# Patient Record
Sex: Male | Born: 1985 | Race: Asian | Hispanic: Yes | Smoking: Current every day smoker
Health system: Southern US, Community
[De-identification: ages and names within clinical notes are randomized; demographics above are authoritative.]

## PROBLEM LIST (undated history)

## (undated) DIAGNOSIS — K920 Hematemesis: Secondary | ICD-10-CM

## (undated) HISTORY — PX: RIB FRACTURE SURGERY: SHX2358

## (undated) HISTORY — PX: ABDOMINAL SURGERY: SHX537

## (undated) HISTORY — PX: SHOULDER SURGERY: SHX246

## (undated) HISTORY — PX: NO PAST SURGERIES: SHX2092

---

## 2017-07-13 ENCOUNTER — Encounter (HOSPITAL_COMMUNITY): Payer: Self-pay

## 2017-07-13 DIAGNOSIS — K2961 Other gastritis with bleeding: Secondary | ICD-10-CM | POA: Diagnosis present

## 2017-07-13 DIAGNOSIS — Z791 Long term (current) use of non-steroidal anti-inflammatories (NSAID): Secondary | ICD-10-CM

## 2017-07-13 DIAGNOSIS — R0789 Other chest pain: Secondary | ICD-10-CM | POA: Diagnosis not present

## 2017-07-13 DIAGNOSIS — K2921 Alcoholic gastritis with bleeding: Principal | ICD-10-CM | POA: Diagnosis present

## 2017-07-13 DIAGNOSIS — K5730 Diverticulosis of large intestine without perforation or abscess without bleeding: Secondary | ICD-10-CM | POA: Diagnosis present

## 2017-07-13 DIAGNOSIS — F064 Anxiety disorder due to known physiological condition: Secondary | ICD-10-CM | POA: Diagnosis not present

## 2017-07-13 DIAGNOSIS — F1010 Alcohol abuse, uncomplicated: Secondary | ICD-10-CM | POA: Diagnosis present

## 2017-07-13 DIAGNOSIS — R000 Tachycardia, unspecified: Secondary | ICD-10-CM | POA: Diagnosis present

## 2017-07-13 DIAGNOSIS — T39395A Adverse effect of other nonsteroidal anti-inflammatory drugs [NSAID], initial encounter: Secondary | ICD-10-CM | POA: Diagnosis present

## 2017-07-13 DIAGNOSIS — F17210 Nicotine dependence, cigarettes, uncomplicated: Secondary | ICD-10-CM | POA: Diagnosis present

## 2017-07-13 LAB — URINALYSIS, ROUTINE W REFLEX MICROSCOPIC
Bilirubin Urine: NEGATIVE
GLUCOSE, UA: NEGATIVE mg/dL
KETONES UR: NEGATIVE mg/dL
Leukocytes, UA: NEGATIVE
Nitrite: NEGATIVE
PROTEIN: NEGATIVE mg/dL
Specific Gravity, Urine: 1.009 (ref 1.005–1.030)
Squamous Epithelial / LPF: NONE SEEN
pH: 6 (ref 5.0–8.0)

## 2017-07-13 LAB — CBC
HEMATOCRIT: 44.5 % (ref 39.0–52.0)
HEMOGLOBIN: 14.4 g/dL (ref 13.0–17.0)
MCH: 28.2 pg (ref 26.0–34.0)
MCHC: 32.4 g/dL (ref 30.0–36.0)
MCV: 87.3 fL (ref 78.0–100.0)
Platelets: 275 10*3/uL (ref 150–400)
RBC: 5.1 MIL/uL (ref 4.22–5.81)
RDW: 16.8 % — ABNORMAL HIGH (ref 11.5–15.5)
WBC: 7.5 10*3/uL (ref 4.0–10.5)

## 2017-07-13 LAB — COMPREHENSIVE METABOLIC PANEL
ALBUMIN: 4.3 g/dL (ref 3.5–5.0)
ALT: 12 U/L — ABNORMAL LOW (ref 17–63)
ANION GAP: 13 (ref 5–15)
AST: 26 U/L (ref 15–41)
Alkaline Phosphatase: 79 U/L (ref 38–126)
BUN: <5 mg/dL — ABNORMAL LOW (ref 6–20)
CHLORIDE: 104 mmol/L (ref 101–111)
CO2: 21 mmol/L — AB (ref 22–32)
Calcium: 9.1 mg/dL (ref 8.9–10.3)
Creatinine, Ser: 0.77 mg/dL (ref 0.61–1.24)
GFR calc non Af Amer: >60 mL/min (ref >60)
GLUCOSE: 111 mg/dL — AB (ref 65–99)
POTASSIUM: 3.9 mmol/L (ref 3.5–5.1)
SODIUM: 138 mmol/L (ref 135–145)
Total Bilirubin: 0.7 mg/dL (ref 0.3–1.2)
Total Protein: 7.6 g/dL (ref 6.5–8.1)

## 2017-07-13 LAB — LIPASE, BLOOD: LIPASE: 32 U/L (ref 11–51)

## 2017-07-13 LAB — I-STAT CG4 LACTIC ACID, ED: LACTIC ACID, VENOUS: 1.77 mmol/L (ref 0.5–1.9)

## 2017-07-13 MED ORDER — ONDANSETRON 4 MG PO TBDP
4.0000 mg | ORAL_TABLET | Freq: Once | ORAL | Status: AC | PRN
  Administered 2017-07-13: 4 mg via ORAL
  Filled 2017-07-13: qty 1

## 2017-07-13 NOTE — ED Triage Notes (Signed)
Pt reports that since last Thursday he has had generalized abd pain, started vomiting yesterday, hematuresis today and reports dark tarry stool since yesterday.

## 2017-07-14 ENCOUNTER — Emergency Department (HOSPITAL_COMMUNITY): Payer: Self-pay

## 2017-07-14 ENCOUNTER — Other Ambulatory Visit: Payer: Self-pay

## 2017-07-14 ENCOUNTER — Inpatient Hospital Stay (HOSPITAL_COMMUNITY)
Admission: EM | Admit: 2017-07-14 | Discharge: 2017-07-19 | DRG: 379 | Disposition: A | Discharge status: Home / Self Care | Payer: Self-pay | Attending: Internal Medicine | Admitting: Internal Medicine

## 2017-07-14 ENCOUNTER — Encounter (HOSPITAL_COMMUNITY): Payer: Self-pay | Admitting: General Practice

## 2017-07-14 DIAGNOSIS — F17210 Nicotine dependence, cigarettes, uncomplicated: Secondary | ICD-10-CM

## 2017-07-14 DIAGNOSIS — R079 Chest pain, unspecified: Secondary | ICD-10-CM

## 2017-07-14 DIAGNOSIS — Z9889 Other specified postprocedural states: Secondary | ICD-10-CM

## 2017-07-14 DIAGNOSIS — R1012 Left upper quadrant pain: Secondary | ICD-10-CM

## 2017-07-14 DIAGNOSIS — R1013 Epigastric pain: Secondary | ICD-10-CM | POA: Diagnosis present

## 2017-07-14 DIAGNOSIS — Z791 Long term (current) use of non-steroidal anti-inflammatories (NSAID): Secondary | ICD-10-CM

## 2017-07-14 DIAGNOSIS — K920 Hematemesis: Secondary | ICD-10-CM | POA: Diagnosis present

## 2017-07-14 DIAGNOSIS — K2971 Gastritis, unspecified, with bleeding: Secondary | ICD-10-CM | POA: Diagnosis present

## 2017-07-14 HISTORY — DX: Hematemesis: K92.0

## 2017-07-14 LAB — CBC
HCT: 40.2 % (ref 39.0–52.0)
HEMATOCRIT: 39.2 % (ref 39.0–52.0)
HEMOGLOBIN: 12.2 g/dL — AB (ref 13.0–17.0)
Hemoglobin: 12.9 g/dL — ABNORMAL LOW (ref 13.0–17.0)
MCH: 27.6 pg (ref 26.0–34.0)
MCH: 28.6 pg (ref 26.0–34.0)
MCHC: 31.1 g/dL (ref 30.0–36.0)
MCHC: 32.1 g/dL (ref 30.0–36.0)
MCV: 88.7 fL (ref 78.0–100.0)
MCV: 89.1 fL (ref 78.0–100.0)
PLATELETS: 258 10*3/uL (ref 150–400)
Platelets: 237 10*3/uL (ref 150–400)
RBC: 4.42 MIL/uL (ref 4.22–5.81)
RBC: 4.51 MIL/uL (ref 4.22–5.81)
RDW: 16.6 % — ABNORMAL HIGH (ref 11.5–15.5)
RDW: 16.8 % — AB (ref 11.5–15.5)
WBC: 4.5 10*3/uL (ref 4.0–10.5)
WBC: 5.1 10*3/uL (ref 4.0–10.5)

## 2017-07-14 LAB — TYPE AND SCREEN
ABO/RH(D): O POS
Antibody Screen: NEGATIVE

## 2017-07-14 LAB — ABO/RH: ABO/RH(D): O POS

## 2017-07-14 LAB — POC OCCULT BLOOD, ED: Fecal Occult Bld: NEGATIVE

## 2017-07-14 MED ORDER — PROCHLORPERAZINE EDISYLATE 5 MG/ML IJ SOLN
10.0000 mg | Freq: Once | INTRAMUSCULAR | Status: AC
  Administered 2017-07-14: 10 mg via INTRAVENOUS
  Filled 2017-07-14: qty 2

## 2017-07-14 MED ORDER — SODIUM CHLORIDE 0.9 % IV SOLN
8.0000 mg/h | INTRAVENOUS | Status: DC
  Administered 2017-07-14 – 2017-07-15 (×3): 8 mg/h via INTRAVENOUS
  Filled 2017-07-14 (×5): qty 80

## 2017-07-14 MED ORDER — ONDANSETRON 4 MG PO TBDP
4.0000 mg | ORAL_TABLET | Freq: Once | ORAL | Status: AC | PRN
  Administered 2017-07-14: 4 mg via ORAL
  Filled 2017-07-14: qty 1

## 2017-07-14 MED ORDER — ACETAMINOPHEN 650 MG RE SUPP: 650.0000 mg | Freq: Four times a day (QID) | RECTAL | Status: DC | PRN

## 2017-07-14 MED ORDER — ONDANSETRON HCL 4 MG/2ML IJ SOLN
4.0000 mg | Freq: Once | INTRAMUSCULAR | Status: AC
  Administered 2017-07-14: 4 mg via INTRAVENOUS
  Filled 2017-07-14: qty 2

## 2017-07-14 MED ORDER — SODIUM CHLORIDE 0.9 % IV SOLN
INTRAVENOUS | Status: DC
  Administered 2017-07-14 (×2): via INTRAVENOUS

## 2017-07-14 MED ORDER — ACETAMINOPHEN 325 MG PO TABS: 650.0000 mg | ORAL_TABLET | Freq: Four times a day (QID) | ORAL | Status: DC | PRN

## 2017-07-14 MED ORDER — ONDANSETRON HCL 4 MG/2ML IJ SOLN
4.0000 mg | Freq: Three times a day (TID) | INTRAMUSCULAR | Status: DC
Start: 2017-07-14 — End: 2017-07-15
  Administered 2017-07-14 – 2017-07-15 (×3): 4 mg via INTRAVENOUS
  Filled 2017-07-14 (×3): qty 2

## 2017-07-14 MED ORDER — PANTOPRAZOLE SODIUM 40 MG IV SOLR: 40.0000 mg | Freq: Two times a day (BID) | INTRAVENOUS | Status: DC

## 2017-07-14 MED ORDER — HYDROCODONE-ACETAMINOPHEN 5-325 MG PO TABS
1.0000 | ORAL_TABLET | ORAL | Status: DC | PRN
  Administered 2017-07-14: 1 via ORAL
  Administered 2017-07-15 – 2017-07-19 (×13): 2 via ORAL
  Filled 2017-07-14 (×15): qty 2
  Filled 2017-07-14: qty 1
  Filled 2017-07-14 (×3): qty 2

## 2017-07-14 MED ORDER — FAMOTIDINE 20 MG IN NS 100 ML IVPB: 20.0000 mg | Freq: Once | INTRAVENOUS | Status: DC

## 2017-07-14 MED ORDER — PANTOPRAZOLE SODIUM 40 MG IV SOLR
40.0000 mg | Freq: Once | INTRAVENOUS | Status: AC
  Administered 2017-07-14: 40 mg via INTRAVENOUS
  Filled 2017-07-14: qty 40

## 2017-07-14 MED ORDER — HYDROMORPHONE HCL 1 MG/ML IJ SOLN
0.5000 mg | INTRAMUSCULAR | Status: DC | PRN
  Administered 2017-07-14 – 2017-07-16 (×11): 0.5 mg via INTRAVENOUS
  Filled 2017-07-14 (×11): qty 1

## 2017-07-14 MED ORDER — HYDROMORPHONE HCL 1 MG/ML IJ SOLN
0.5000 mg | Freq: Once | INTRAMUSCULAR | Status: AC
  Administered 2017-07-14: 0.5 mg via INTRAVENOUS
  Filled 2017-07-14: qty 1

## 2017-07-14 MED ORDER — POLYETHYLENE GLYCOL 3350 17 G PO PACK
17.0000 g | PACK | Freq: Every day | ORAL | Status: DC | PRN
  Administered 2017-07-17: 17 g via ORAL
  Filled 2017-07-14: qty 1

## 2017-07-14 MED ORDER — HYDROMORPHONE HCL 1 MG/ML IJ SOLN
1.0000 mg | Freq: Once | INTRAMUSCULAR | Status: DC
  Filled 2017-07-14: qty 1

## 2017-07-14 MED ORDER — SODIUM CHLORIDE 0.9 % IV BOLUS (SEPSIS)
500.0000 mL | Freq: Once | INTRAVENOUS | Status: AC
  Administered 2017-07-14: 500 mL via INTRAVENOUS

## 2017-07-14 MED ORDER — IOPAMIDOL (ISOVUE-300) INJECTION 61%
INTRAVENOUS | Status: AC
  Administered 2017-07-14: 100 mL
  Filled 2017-07-14: qty 100

## 2017-07-14 MED ORDER — GI COCKTAIL ~~LOC~~
30.0000 mL | Freq: Once | ORAL | Status: DC
  Filled 2017-07-14: qty 30

## 2017-07-14 MED ORDER — FENTANYL CITRATE (PF) 100 MCG/2ML IJ SOLN
50.0000 ug | Freq: Once | INTRAMUSCULAR | Status: AC
  Administered 2017-07-14: 50 ug via INTRAVENOUS
  Filled 2017-07-14: qty 2

## 2017-07-14 MED ORDER — SODIUM CHLORIDE 0.9 % IV BOLUS (SEPSIS)
1000.0000 mL | Freq: Once | INTRAVENOUS | Status: AC
  Administered 2017-07-14: 1000 mL via INTRAVENOUS

## 2017-07-14 NOTE — ED Notes (Signed)
GI at bedside
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
## 2017-07-14 NOTE — ED Notes (Signed)
Patient transported to CT 
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
## 2017-07-14 NOTE — ED Notes (Signed)
Patient came up to registration window saying that "he's been throwing up blood."
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
## 2017-07-15 ENCOUNTER — Encounter (HOSPITAL_COMMUNITY): Payer: Self-pay | Admitting: Certified Registered Nurse Anesthetist

## 2017-07-15 ENCOUNTER — Encounter (HOSPITAL_COMMUNITY)
Admission: EM | Disposition: A | Discharge status: Home / Self Care | Payer: Self-pay | Source: Home / Self Care | Attending: Internal Medicine

## 2017-07-15 ENCOUNTER — Inpatient Hospital Stay (HOSPITAL_COMMUNITY): Payer: Self-pay | Admitting: Certified Registered Nurse Anesthetist

## 2017-07-15 DIAGNOSIS — R1013 Epigastric pain: Secondary | ICD-10-CM | POA: Diagnosis present

## 2017-07-15 DIAGNOSIS — K922 Gastrointestinal hemorrhage, unspecified: Secondary | ICD-10-CM

## 2017-07-15 DIAGNOSIS — F1099 Alcohol use, unspecified with unspecified alcohol-induced disorder: Secondary | ICD-10-CM

## 2017-07-15 HISTORY — PX: ESOPHAGOGASTRODUODENOSCOPY: SHX5428

## 2017-07-15 LAB — CBC
HCT: 42.1 % (ref 39.0–52.0)
HCT: 40.9 % (ref 39.0–52.0)
Hemoglobin: 13 g/dL (ref 13.0–17.0)
Hemoglobin: 12.9 g/dL — ABNORMAL LOW (ref 13.0–17.0)
MCH: 27.3 pg (ref 26.0–34.0)
MCH: 27.9 pg (ref 26.0–34.0)
MCHC: 30.9 g/dL (ref 30.0–36.0)
MCHC: 31.5 g/dL (ref 30.0–36.0)
MCV: 88.4 fL (ref 78.0–100.0)
MCV: 88.3 fL (ref 78.0–100.0)
PLATELETS: 235 10*3/uL (ref 150–400)
PLATELETS: 250 10*3/uL (ref 150–400)
RBC: 4.76 MIL/uL (ref 4.22–5.81)
RBC: 4.63 MIL/uL (ref 4.22–5.81)
RDW: 16.5 % — AB (ref 11.5–15.5)
RDW: 16.3 % — ABNORMAL HIGH (ref 11.5–15.5)
WBC: 5.3 10*3/uL (ref 4.0–10.5)
WBC: 7.4 10*3/uL (ref 4.0–10.5)

## 2017-07-15 LAB — HIV ANTIBODY (ROUTINE TESTING W REFLEX): HIV Screen 4th Generation wRfx: NONREACTIVE

## 2017-07-15 SURGERY — EGD (ESOPHAGOGASTRODUODENOSCOPY)
Anesthesia: General

## 2017-07-15 MED ORDER — SODIUM CHLORIDE 0.9 % IV SOLN
8.0000 mg | Freq: Three times a day (TID) | INTRAVENOUS | Status: DC
  Administered 2017-07-15: 8 mg via INTRAVENOUS
  Filled 2017-07-15: qty 4

## 2017-07-15 MED ORDER — PROPOFOL 10 MG/ML IV BOLUS
INTRAVENOUS | Status: DC | PRN
  Administered 2017-07-15: 150 mg via INTRAVENOUS

## 2017-07-15 MED ORDER — PANTOPRAZOLE SODIUM 40 MG IV SOLR
40.0000 mg | Freq: Two times a day (BID) | INTRAVENOUS | Status: DC
Start: 2017-07-15 — End: 2017-07-17
  Administered 2017-07-15 – 2017-07-17 (×5): 40 mg via INTRAVENOUS
  Filled 2017-07-15 (×5): qty 40

## 2017-07-15 MED ORDER — DEXAMETHASONE SODIUM PHOSPHATE 10 MG/ML IJ SOLN
INTRAMUSCULAR | Status: DC | PRN
  Administered 2017-07-15: 10 mg via INTRAVENOUS

## 2017-07-15 MED ORDER — MIDAZOLAM HCL 5 MG/5ML IJ SOLN
INTRAMUSCULAR | Status: DC | PRN
  Administered 2017-07-15: 2 mg via INTRAVENOUS

## 2017-07-15 MED ORDER — LACTATED RINGERS IV SOLN
INTRAVENOUS | Status: AC | PRN
  Administered 2017-07-15: 11:00:00 via INTRAVENOUS
  Administered 2017-07-15: 1000 mL via INTRAVENOUS

## 2017-07-15 MED ORDER — ONDANSETRON HCL 4 MG/2ML IJ SOLN
INTRAMUSCULAR | Status: DC | PRN
  Administered 2017-07-15: 4 mg via INTRAVENOUS

## 2017-07-15 MED ORDER — PANTOPRAZOLE SODIUM 40 MG PO TBEC: 40.0000 mg | DELAYED_RELEASE_TABLET | Freq: Every day | ORAL | Status: DC

## 2017-07-15 MED ORDER — SUCCINYLCHOLINE CHLORIDE 200 MG/10ML IV SOSY
PREFILLED_SYRINGE | INTRAVENOUS | Status: DC | PRN
  Administered 2017-07-15: 100 mg via INTRAVENOUS

## 2017-07-15 MED ORDER — SODIUM CHLORIDE 0.9 % IV SOLN
8.0000 mg | Freq: Three times a day (TID) | INTRAVENOUS | Status: DC | PRN
  Administered 2017-07-16: 8 mg via INTRAVENOUS
  Filled 2017-07-15 (×2): qty 4

## 2017-07-15 MED ORDER — FENTANYL CITRATE (PF) 100 MCG/2ML IJ SOLN
INTRAMUSCULAR | Status: DC | PRN
  Administered 2017-07-15 (×2): 50 ug via INTRAVENOUS

## 2017-07-15 MED ORDER — ONDANSETRON HCL 4 MG/2ML IJ SOLN: 8.0000 mg | Freq: Three times a day (TID) | INTRAMUSCULAR | Status: DC

## 2017-07-15 MED ORDER — LIDOCAINE 2% (20 MG/ML) 5 ML SYRINGE
INTRAMUSCULAR | Status: DC | PRN
  Administered 2017-07-15: 80 mg via INTRAVENOUS

## 2017-07-15 NOTE — Transfer of Care (Signed)
Immediate Anesthesia Transfer of Care Note  Patient: Joeseph AmorRoberto Boreman  Procedure(s) Performed: ESOPHAGOGASTRODUODENOSCOPY (EGD) (N/A )  Patient Location: PACU and Endoscopy Unit  Anesthesia Type:General  Level of Consciousness: awake, drowsy and patient cooperative  Airway & Oxygen Therapy: Patient Spontanous Breathing and Patient connected to face mask oxygen  Post-op Assessment: Report given to RN and Post -op Vital signs reviewed and stable  Post vital signs: Reviewed and stable  Last Vitals:  Vitals:   07/15/17 0541 07/15/17 1008  BP: 107/76 133/76  Pulse: 77   Resp: 18 16  Temp: 36.6 C 36.8 C  SpO2: 98% 95%    Last Pain:  Vitals:   07/15/17 1008  TempSrc: Oral  PainSc: 7       Patients Stated Pain Goal: 2 (07/15/17 0730)  Complications: No apparent anesthesia complications

## 2017-07-15 NOTE — Anesthesia Preprocedure Evaluation (Signed)
Anesthesia Evaluation  Patient identified by MRN, date of birth, ID band Patient awake    Reviewed: Allergy & Precautions, H&P , NPO status , Patient's Chart, lab work & pertinent test results  Airway Mallampati: II   Neck ROM: full    Dental   Pulmonary Current Smoker,    breath sounds clear to auscultation       Cardiovascular negative cardio ROS   Rhythm:regular Rate:Normal     Neuro/Psych    GI/Hepatic hematemesis   Endo/Other    Renal/GU      Musculoskeletal   Abdominal   Peds  Hematology   Anesthesia Other Findings   Reproductive/Obstetrics                             Anesthesia Physical Anesthesia Plan  ASA: II  Anesthesia Plan: General   Post-op Pain Management:    Induction: Intravenous, Rapid sequence and Cricoid pressure planned  PONV Risk Score and Plan: 1 and Ondansetron and Treatment may vary due to age or medical condition  Airway Management Planned: Oral ETT  Additional Equipment:   Intra-op Plan:   Post-operative Plan: Extubation in OR  Informed Consent: I have reviewed the patients History and Physical, chart, labs and discussed the procedure including the risks, benefits and alternatives for the proposed anesthesia with the patient or authorized representative who has indicated his/her understanding and acceptance.     Plan Discussed with: CRNA, Anesthesiologist and Surgeon  Anesthesia Plan Comments:         Anesthesia Quick Evaluation

## 2017-07-15 NOTE — Interval H&P Note (Signed)
History and Physical Interval Note:  07/15/2017 10:29 AM  Dylan Carrillo  has presented today for surgery, with the diagnosis of hematemesis, upper abdominal pain  The various methods of treatment have been discussed with the patient and family. After consideration of risks, benefits and other options for treatment, the patient has consented to  Procedure(s): ESOPHAGOGASTRODUODENOSCOPY (EGD) (N/A) as a surgical intervention .  The patient's history has been reviewed, patient examined, no change in status, stable for surgery.  I have reviewed the patient's chart and labs.  Questions were answered to the patient's satisfaction.     Charlie PitterHenry L Danis III

## 2017-07-15 NOTE — Op Note (Signed)
[facility] Patient Name: Dylan Carrillo Procedure Date : 07/15/2017 MRN: [id number] Attending MD: Starr Lake. Myrtie Neither , MD Date of Birth: 1986/04/19 CSN: [id number] Age: 32 Admit Type: Inpatient Procedure:                Upper GI endoscopy Indications:              Epigastric abdominal pain, Hematemesis Providers:                Sherilyn Cooter L. Myrtie Neither, MD, Bonney Leitz, Arlee Muslim                            Tech., Technician, Verita Schneiders, Technician, Levora Angel, CRNA Referring MD:              Medicines:                General Anesthesia Complications:            No immediate complications. Estimated Blood Loss:     Estimated blood loss: none. Procedure:                Pre-Anesthesia Assessment:                           - Prior to the procedure, a History and Physical                            was performed, and patient medications and                            allergies were reviewed. The patient's tolerance of                            previous anesthesia was also reviewed. The risks                            and benefits of the procedure and the sedation                            options and risks were discussed with the patient.                            All questions were answered, and informed consent                            was obtained. Prior Anticoagulants: The patient has                            taken no previous anticoagulant or antiplatelet                            agents. ASA Grade Assessment: III - A patient with  severe systemic disease. After reviewing the risks                            and benefits, the patient was deemed in                            satisfactory condition to undergo the procedure.                           After obtaining informed consent, the endoscope was                            passed under direct vision. Throughout the                            procedure, the  patient's blood pressure, pulse, and                            oxygen saturations were monitored continuously. The                            Endoscope was introduced through the mouth, and                            advanced to the second part of duodenum. The upper                            GI endoscopy was accomplished without difficulty.                            The patient tolerated the procedure well. Scope In: Scope Out: Findings:      The hypopharynx was normal (though visualization limited by ETT). There       was fresh blood seen during intubation. It was cleared with suction and       no underlying bleeding source seen.      The examined esophagus was normal.      Hematin (altered blood/coffee-ground-like material) was found in the       entire examined stomach. Extensive lavage and mucosal inspection       performed, and no bleeding sources seen.      The exam of the stomach was otherwise normal.      The cardia and gastric fundus were normal on retroflexion.      The examined duodenum was normal. Impression:               - Normal larynx.                           - Normal esophagus.                           - Hematin (altered blood/coffee-ground-like                            material) in the entire stomach.                           -  Normal examined duodenum.                           - No specimens collected.                           Bleeding appears to have been from NSAID and                            alcohol-related gastropathy. Moderate Sedation:      GETA Recommendation:           - Return patient to hospital ward for ongoing care.                           - Clear liquid diet. Advance to regular diet as                            tolerated. Home when tolerating regular diet and                            vomiting has ceased. Follow up with GI as needed.                           - Use Protonix (pantoprazole) 40 mg PO daily for 2                             weeks.                           - Post procedure medication orders were given. Procedure Code(s):        --- Professional ---                           [phone], Esophagogastroduodenoscopy, flexible,                            transoral; diagnostic, including collection of                            specimen(s) by brushing or washing, when performed                            (separate procedure) Diagnosis Code(s):        --- Professional ---                           K92.2, Gastrointestinal hemorrhage, unspecified                           R10.13, Epigastric pain                           K92.0, Hematemesis CPT copyright 2016 [employer]. All rights reserved. The codes documented in this report are preliminary and upon coder review may  be revised to meet current compliance requirements. Henry L. Danis, MD 07/15/2017 11:09:47 AM This  report has been signed electronically. Number of Addenda: 0
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
## 2017-07-15 NOTE — Anesthesia Procedure Notes (Addendum)
Procedure Name: Intubation Date/Time: 07/15/2017 10:38 AM Performed by: Lowella Dell, CRNA Pre-anesthesia Checklist: Patient identified, Emergency Drugs available, Suction available and Patient being monitored Patient Re-evaluated:Patient Re-evaluated prior to induction Oxygen Delivery Method: Circle System Utilized Preoxygenation: Pre-oxygenation with 100% oxygen Induction Type: IV induction, Rapid sequence and Cricoid Pressure applied Laryngoscope Size: Mac and 3 Grade View: Grade I Tube type: Oral Tube size: 7.5 mm Number of attempts: 1 Airway Equipment and Method: Stylet Placement Confirmation: ETT inserted through vocal cords under direct vision,  positive ETCO2 and breath sounds checked- equal and bilateral Secured at: 23 cm Tube secured with: Tape Dental Injury: Teeth and Oropharynx as per pre-operative assessment

## 2017-07-16 DIAGNOSIS — F419 Anxiety disorder, unspecified: Secondary | ICD-10-CM

## 2017-07-16 DIAGNOSIS — K2971 Gastritis, unspecified, with bleeding: Secondary | ICD-10-CM | POA: Diagnosis present

## 2017-07-16 DIAGNOSIS — K2901 Acute gastritis with bleeding: Secondary | ICD-10-CM

## 2017-07-16 DIAGNOSIS — Z87828 Personal history of other (healed) physical injury and trauma: Secondary | ICD-10-CM

## 2017-07-16 LAB — CBC
HCT: 41.6 % (ref 39.0–52.0)
Hemoglobin: 13.1 g/dL (ref 13.0–17.0)
MCH: 27.7 pg (ref 26.0–34.0)
MCHC: 31.5 g/dL (ref 30.0–36.0)
MCV: 87.9 fL (ref 78.0–100.0)
Platelets: 249 10*3/uL (ref 150–400)
RBC: 4.73 MIL/uL (ref 4.22–5.81)
RDW: 16 % — ABNORMAL HIGH (ref 11.5–15.5)
WBC: 8.6 10*3/uL (ref 4.0–10.5)

## 2017-07-16 MED ORDER — HYDROMORPHONE HCL 1 MG/ML IJ SOLN
1.0000 mg | INTRAMUSCULAR | Status: DC | PRN
  Administered 2017-07-16 – 2017-07-18 (×11): 1 mg via INTRAVENOUS
  Filled 2017-07-16 (×11): qty 1

## 2017-07-16 MED ORDER — SODIUM CHLORIDE 0.9 % IV BOLUS (SEPSIS)
1000.0000 mL | Freq: Once | INTRAVENOUS | Status: AC
  Administered 2017-07-16: 1000 mL via INTRAVENOUS

## 2017-07-16 NOTE — Progress Notes (Addendum)
[facility] Progress Note   Chief Complaint:   Hematemesis and abdominal pain    SUBJECTIVE:   Ate breakfast then vomited food containing bright red blood    ASSESSMENT AND PLAN:    32 yo with upper abdominal pain / hematemesis. EGD yesterday - blood in hypopharynx. Coffee grounds throughout stomach with identifiable source. Bleeding fel to be secondary to ETOH / NSAID gastropathy. Hgb was remained stable, actually up to 13.1 today.  -PPI daily for 2 weeks -Recommend he stop or significantly reduce NSAID usage.  -having problems with anxiety. Admitting team has asked Chaplain to see.  -would not give patient narcotics for the pain.    OBJECTIVE:      Vital signs in last 24 hours: Temp:  [97.9 F (36.6 C)-98.3 F (36.8 C)] 98.1 F (36.7 C) (02/21 0400) Pulse Rate:  [71-131] 107 (02/21 0400) Resp:  [12-20] 20 (02/21 0400) BP: (111-152)/(59-121) 111/77 (02/21 0400) SpO2:  [95 %-100 %] 96 % (02/21 0400) Weight:  [150 lb (68 kg)] 150 lb (68 kg) (02/20 1008) Last BM Date: 07/15/17 General:   Alert, well-developed,male in NAD EENT:  Normal hearing, non icteric sclera, conjunctive pink.  Heart:  Regular rate and rhythm; no murmurs. No lower extremity edema Pulm: Normal respiratory effort, lungs CTA bilaterally without wheezes or crackles. Abdomen:  Soft, nondistended, nontender.  Normal bowel sounds, no masses felt. No hepatomegaly.    Neurologic:  Alert and  oriented x4;  grossly normal neurologically. Psych:  Pleasant, cooperative.  Normal mood and affect.   Intake/Output from previous day: 02/20 0701 - 02/21 0700 In: 1054 [I.V.:1000; IV Piggyback:54] Out: 501 [Urine:500; Emesis/NG output:1] Intake/Output this shift: No intake/output data recorded.  Lab Results: Recent Labs    07/15/17 0417 07/15/17 1210 07/16/17 0516  WBC 5.3 7.4 8.6  HGB 13.0 12.9* 13.1  HCT 42.1 40.9 41.6  PLT 235 250 249   BMET Recent Labs    07/13/17 2028  NA 138    K 3.9  CL 104  CO2 21*  GLUCOSE 111*  BUN <5*  CREATININE 0.77  CALCIUM 9.1   LFT Recent Labs    07/13/17 2028  PROT 7.6  ALBUMIN 4.3  AST 26  ALT 12*  ALKPHOS 79  BILITOT 0.7    Ct Abdomen Pelvis W Contrast  Result Date: 07/14/2017 CLINICAL DATA:  Generalized abdominal pain and vomiting. EXAM: CT ABDOMEN AND PELVIS WITH CONTRAST TECHNIQUE: Multidetector CT imaging of the abdomen and pelvis was performed using the standard protocol following bolus administration of intravenous contrast. CONTRAST:  ISOVUE-300 IOPAMIDOL (ISOVUE-300) INJECTION 61% COMPARISON:  None. FINDINGS: Lower chest: The lung bases are clear of acute process. No pleural effusion or pulmonary lesions. The heart is normal in size. No pericardial effusion. The distal esophagus and aorta are unremarkable. Hepatobiliary: No focal hepatic lesions or intrahepatic biliary dilatation. The gallbladder is normal. No common bile duct dilatation. Pancreas: No mass, inflammation or ductal dilatation. Spleen: Normal size.  No focal lesions. Adrenals/Urinary Tract: The adrenal glands and kidneys are unremarkable. The bladder is normal. Stomach/Bowel: The stomach, duodenum, small bowel and colon are grossly normal without oral contrast. The terminal ileum is normal. The appendix is normal. Diverticulosis involving the transverse colon but no findings to suggest acute diverticulitis. Vascular/Lymphatic: The aorta is normal in caliber. No dissection. The branch vessels are patent. The major venous structures are patent. No mesenteric or retroperitoneal mass or adenopathy. Small scattered lymph nodes are noted. Reproductive: Normal prostate  gland and seminal vesicles. Other: No pelvic mass or adenopathy. No free pelvic fluid collections. No inguinal mass or adenopathy. No abdominal wall hernia or subcutaneous lesions. Musculoskeletal: No significant bony findings. Lumbar scoliosis and remote appearing compression fracture of L2.  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 IMPRESSION: 1. No acute abdominal/pelvic findings, mass lesions or lymphadenopathy. 2. Transverse colon diverticulosis without findings for acute diverticulitis. 3. Remote L2 compression fracture. Electronically Signed   By: Rudie MeyerP.  Gallerani M.D.   On: 07/14/2017 10:12     Principal Problem:   Hematemesis Active Problems:   Epigastric pain     LOS: 2 days   Willette ClusterPaula Guenther ,NP 07/16/2017, 9:58 AM  Pager number [phone]  I have discussed the case with the PA, and that is the plan I formulated. I personally interviewed and examined the patient.  Thus far, no findings to explain his abdominal pain on EGD or CT scan. I suspect excess NSAIDs, weekend EtOH and some anxiety are contributing to that.  However, NO source of the bleeding was seen on EGD, and there was fresh blood in the hypopharynx during EGD.  While it would not explain his abdominal pain, perhaps there is an ENT source of bleeding. I recommend ENT evaluation.  I also feel opioids are making this nausea and vomiting worse.  He needs treatment for anxiety.  Signing off - call as needed.   Charlie PitterHenry L Danis III Pager [phone]  Mon-Fri 8a-5p [phone] after 5p, weekends, holidays

## 2017-07-16 NOTE — Anesthesia Postprocedure Evaluation (Signed)
Anesthesia Post Note  Patient: Joeseph AmorRoberto Treaster  Procedure(s) Performed: ESOPHAGOGASTRODUODENOSCOPY (EGD) (N/A )     Patient location during evaluation: Endoscopy Anesthesia Type: General Level of consciousness: awake and alert Pain management: pain level controlled Vital Signs Assessment: post-procedure vital signs reviewed and stable Respiratory status: spontaneous breathing, nonlabored ventilation, respiratory function stable and patient connected to nasal cannula oxygen Cardiovascular status: blood pressure returned to baseline and stable Postop Assessment: no apparent nausea or vomiting Anesthetic complications: no    Last Vitals:  Vitals:   07/15/17 2306 07/16/17 0400  BP: 119/88 111/77  Pulse: (!) 131 (!) 107  Resp: 18 20  Temp: 36.8 C 36.7 C  SpO2: 95% 96%    Last Pain:  Vitals:   07/16/17 0755  TempSrc:   PainSc: 9                  Stephaie Dardis S

## 2017-07-16 NOTE — Progress Notes (Signed)
   Subjective:  Patient was seen this morning sitting up in bed. He continues to endorse epigastric and left sided abdominal pain. He also has continued to have episodes of bloody emesis and nausea. States the nausea and pain are not fully managed by his current medications. He was able to tolerate a small amount of food yesterday evening, but this morning after attempting to eat breakfast, he had another episode of hematemesis - breakfast tray in room with bright red blood. Denies palpitations. Does endorse mild lightheadedness/dizziness with standing. Also continues to endorse significant anxiety surrounding this hospitalization due to his previous major surgeries after his work accident. States that he is worried about all of his healthcare, his job, and his family.  Objective:  Vital signs in last 24 hours: Vitals:   07/15/17 1152 07/15/17 1523 07/15/17 2306 07/16/17 0400  BP: 112/87 (!) 152/121 119/88 111/77  Pulse: 84 (!) 109 (!) 131 (!) 107  Resp: 15 20 18 20   Temp: 97.9 F (36.6 C) 98 F (36.7 C) 98.2 F (36.8 C) 98.1 F (36.7 C)  TempSrc: Oral Oral Oral Oral  SpO2: 100% 100% 95% 96%  Weight:      Height:       GEN: Well-appearing, young male sitting up in bed. Appears uncomfortable but in NAD. Alert and oriented. HENT: [place]/AT. Moist mucous membranes. No visible lesions. EYES: PERRL. Sclera non-icteric. Conjunctiva clear. RESP: Clear to auscultation bilaterally. No wheezes, rales, or rhonchi. No increased work of breathing. CV: Normal rate and regular rhythm. No murmurs, gallops, or rubs. No LE edema. ABD: Soft. TTP in epigastric and LUQ. No rebound or guarding. Non-distended. Normoactive bowel sounds. Well-healed midline abdominal scar. Active hematemesis. EXT: No edema. Warm and well perfused. Capillary refill <2sec. NEURO: Cranial nerves II-XII grossly intact. Able to lift all four extremities against gravity. No apparent audiovisual hallucinations. Speech fluent and  appropriate. PSYCH: Patient is anxious and pleasant. Appropriate affect. Well-groomed; speech is appropriate and on-subject.  Assessment/Plan:  Principal Problem:   Hematemesis Active Problems:   Epigastric pain  Dylan Carrillo is a 32yo male with PMH of prior ex-lap secondary to work accident in 2017 who presents with epigastric/LUQ abdominal pain, hematemesis, and melena in the setting of increased NSAID use, concerning for upper GI bleed. EGD 2/20 without area of discrete bleeding. Abdominal pain and hematemesis thought to be secondary to gastropathy, secondary to increased NSAID and alcohol use. Continuing to have significant abdominal pain, hematemesis, and anxiety.  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 Continues to endorse abdominal pain and hematemesis. Hb stable, 13.1 this morning. Patient endorses lightheadedness and dizziness with exertion, likely related to hypovolemia. Denies palpitations. Endorses anxiety surrounding his current hospitalization, will ask chaplain to stop by to assist with support. - IV NS 1L bolus - I/Os - Increase to IV dilaudid 1mg  q4h PRN and PO norco 5-325 q4h PRN for pain control - IV Protonix 40mg  BID - CLD, advance to regular diet as tolerated - IV zofran 8mg  TID PRN - CBC in AM - Transfuse Hb <7 - Will d/c with protonix 40mg  PO daily for 2 weeks and f/u with GI as needed - Will need to establish with PCP on d/c - Consult spiritual services  Dispo: Anticipated discharge in approximately 1-2 day(s).  Scherrie GerlachHuang, Barbara Ahart, MD 07/16/2017, 7:01 AM Pager: Demetrius CharityP [phone]

## 2017-07-16 NOTE — Progress Notes (Signed)
Visited with patient per [facility] to support patient who was having high levels of anxiety and fears.  Patient shared his story and concerns  Pt requested sacred reading and prayer.   Request completed. Patient appeared to be coping better after visit.  Provided presence, empathetic listening, emotional and spiritual support and guidance. Chaplain available as needed.  Venida JarvisWatlington, Tiarra Anastacio, [place], [facility], Pager [phone]

## 2017-07-17 LAB — CBC
HCT: 42.6 % (ref 39.0–52.0)
Hemoglobin: 13.6 g/dL (ref 13.0–17.0)
MCH: 28.6 pg (ref 26.0–34.0)
MCHC: 31.9 g/dL (ref 30.0–36.0)
MCV: 89.5 fL (ref 78.0–100.0)
Platelets: 251 10*3/uL (ref 150–400)
RBC: 4.76 MIL/uL (ref 4.22–5.81)
RDW: 16.8 % — ABNORMAL HIGH (ref 11.5–15.5)
WBC: 4.5 10*3/uL (ref 4.0–10.5)

## 2017-07-17 MED ORDER — PANTOPRAZOLE SODIUM 40 MG PO TBEC
40.0000 mg | DELAYED_RELEASE_TABLET | Freq: Two times a day (BID) | ORAL | Status: DC
  Administered 2017-07-17 – 2017-07-19 (×4): 40 mg via ORAL
  Filled 2017-07-17 (×4): qty 1

## 2017-07-17 NOTE — Progress Notes (Signed)
Pt still throwing up bright flank blood. Prn miralax given for c/o constipation

## 2017-07-17 NOTE — Progress Notes (Signed)
[facility] Progress [facility]  Chief Complaint: Epigastric pain  History:  Dylan Carrillo's epigastric pain is diminishing. He ate breakfast today and had nausea. He brought up some scant blood. He denies chest pain or dyspnea. He melena Dylan [facility]illo remains normal  ROS: Cardiovascular:  no chest pain Respiratory: no dyspnea  Objective:  Med list reviewed  Vital signs in last 24 hrs: Vitals:   07/16/17 2144 07/17/17 0524  BP: 108/71 123/89  Pulse: 88 64  Resp: 16 17  Temp: 98.2 F (36.8 C) 97.8 F (36.6 C)  SpO2: 97% 98%    Physical Exam   HEENT: sclera anicteric, oral mucosa moist without lesions. The flashlight exam sees no bleeding lesion in either nostril, oropharynx/hypopharynx or tongue  Neck: supple, no thyromegaly, JVD or lymphadenopathy  Cardiac: RRR without murmurs, S1S2 heard, no peripheral edema  Pulm: clear to auscultation bilaterally, normal RR and effort noted  Abdomen: soft, mild epigastric tenderness, with active bowel sounds of normal character. No guarding or palpable hepatosplenomegaly, no distention  Skin; warm and dry, no jaundice or rash  Recent Labs:  Recent Labs  Lab 07/15/17 1210 07/16/17 0516 07/17/17 0708  WBC 7.4 8.6 4.5  HGB 12.9* 13.1 13.6  HCT 40.9 41.6 42.6  PLT 250 249 251   Recent Labs  Lab 07/13/17 2028  NA 138  K 3.9  CL 104  CO2 21*  BUN <5*  ALBUMIN 4.3  ALKPHOS 79  ALT 12*  AST 26  GLUCOSE 111*   No results for input(s): INR in the last 168 hours.  @ASSESSMENTPLANBEGIN @ Assessment:  Epigastric pain that appears to be from NSAID abuse and weekend alcohol excess, I believe there is also an overlay of anxiety. Hematemesis with no source found on upper endoscopy. He has not had epistaxis mother was some old blood in his hypopharynx at the time of upper endoscopy done under general anesthesia, but no bleeding source seen, limited exam with upper endoscope.   Plan:  I think he can be discharged today, no NSAIDs or  alcohol. Return as needed.   Dylan Carrillo Pager [phone] Mon-Fri 8a-5p [phone] after 5p, weekends, holidays

## 2017-07-17 NOTE — Progress Notes (Signed)
Subjective:  Patient was seen sitting up in bed this morning. He reports that his epigastric and left sided abdominal pain are improving after the increase in pain medication yesterday. He also reports decrease in the frequency of his emesis, although still having bloody emesis. He was able to tolerate a small amount of food yesterday and is able to drink water. He walked around the floor yesterday. Does endorse some lightheadedness and dizziness with standing. He states that talking to the chaplain yesterday was helpful and that his symptoms of anxiety do not affect him in his usual life. He states that he is really only feeling anxious due to being back in the hospital after his previous major surgeries. We provided reassurance that he was getting better and we would continue to monitor for improvement.  Objective:  Vital signs in last 24 hours: Vitals:   07/16/17 0400 07/16/17 1454 07/16/17 2144 07/17/17 0524  BP: 111/77 120/78 108/71 123/89  Pulse: (!) 107 (!) 101 88 64  Resp: 20 20 16 17   Temp: 98.1 F (36.7 C) 98.2 F (36.8 C) 98.2 F (36.8 C) 97.8 F (36.6 C)  TempSrc: Oral Oral Oral Oral  SpO2: 96% 98% 97% 98%  Weight:      Height:       GEN: Well-appearing, young male sitting up in bed. Appears uncomfortable but in NAD. Alert and oriented. HENT: [place]/AT. Moist mucous membranes. No visible lesions. EYES: PERRL. Sclera non-icteric. Conjunctiva clear. RESP: Clear to auscultation bilaterally. No wheezes, rales, or rhonchi. No increased work of breathing. CV: Normal rate and regular rhythm. No murmurs, gallops, or rubs. No LE edema. ABD: Soft. TTP in epigastric and LUQ. No rebound or guarding. Non-distended. Normoactive bowel sounds. Well-healed midline abdominal scar. EXT: No edema. Warm and well perfused. Capillary refill <2sec. NEURO: Cranial nerves II-XII grossly intact. Able to lift all four extremities against gravity. No apparent audiovisual hallucinations. Speech fluent and  appropriate. PSYCH: Patient is less anxious and pleasant. Appropriate affect. Well-groomed; speech is appropriate and on-subject.  Assessment/Plan:  Principal Problem:   Gastritis with hemorrhage Active Problems:   Hematemesis   Epigastric pain  Dylan Carrillo is a 32yo male with PMH of prior ex-lap secondary to work accident in 2017 who presents with epigastric/LUQ abdominal pain, hematemesis, and melena in the setting of increased NSAID use, concerning for upper GI bleed. EGD 2/20 without area of discrete bleeding. Abdominal pain and hematemesis thought to be secondary to gastropathy, secondary to increased NSAID and alcohol use. Alternatively, could be ENT etiology, although that would not explain his significant abdominal pain. He does report that his abdominal pain and hematemesis are improving, so we will continue conservative management.  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 Reports improvement in abdominal pain and hematemesis. Hb stable, 13.6 this morning. Patient endorses lightheadedness and dizziness with exertion, likely related to hypovolemia in setting of decreased oral intake. Denies palpitations. Discussed discharge planning with patient today. He states that he prefers one more day on his current pain regimen since he feels that it is working and he is able to get some rest finally. Will start weaning IV pain medication tomorrow. He is also amenable to following up in IMTS clinic on discharge to establish care. - IV NS 1L bolus - I/Os - IV dilaudid 1mg  q4h PRN and PO norco 5-325 q4h PRN for pain control - IV Protonix 40mg  BID - CLD, advance to regular diet as tolerated - IV zofran  8mg  TID PRN - CBC, CMP, lipase in AM to assess for other possible etiologies - Transfuse Hb <7 - Will d/c with protonix 40mg  PO daily for 2 weeks and f/u with GI as needed - Will set up appointment in IMTS clinic to establish care on  d/c  Anxiety Surrounding his hospitalization after having significant surgery in the past for a work accident. States that talking to the chaplain yesterday was helpful. Also notes that he does not typically have anxious thoughts when not in the hospital. I suspect his anxiety is related to his hospitalization rather than a chronic issue. We are continuing to provide reassurance regarding his health, which I think helps him feel more calm. He does seem less anxious today. - Continue to monitor - Outpatient PCP f/u for continued care  Dispo: Anticipated discharge in approximately 1-2 day(s).  Dylan Carrillo, Dylan Calles, MD 07/17/2017, 11:45 AM Pager: Demetrius CharityP [phone]

## 2017-07-18 ENCOUNTER — Inpatient Hospital Stay (HOSPITAL_COMMUNITY): Payer: Self-pay

## 2017-07-18 DIAGNOSIS — R079 Chest pain, unspecified: Secondary | ICD-10-CM

## 2017-07-18 LAB — COMPREHENSIVE METABOLIC PANEL
ALT: 15 U/L — ABNORMAL LOW (ref 17–63)
AST: 28 U/L (ref 15–41)
Albumin: 3.8 g/dL (ref 3.5–5.0)
Alkaline Phosphatase: 75 U/L (ref 38–126)
Anion gap: 11 (ref 5–15)
BUN: 10 mg/dL (ref 6–20)
CO2: 24 mmol/L (ref 22–32)
Calcium: 8.5 mg/dL — ABNORMAL LOW (ref 8.9–10.3)
Chloride: 99 mmol/L — ABNORMAL LOW (ref 101–111)
Creatinine, Ser: 0.75 mg/dL (ref 0.61–1.24)
GFR calc Af Amer: >60 mL/min (ref >60)
GFR calc non Af Amer: >60 mL/min (ref >60)
Glucose, Bld: 102 mg/dL — ABNORMAL HIGH (ref 65–99)
Potassium: 4.6 mmol/L (ref 3.5–5.1)
Sodium: 134 mmol/L — ABNORMAL LOW (ref 135–145)
Total Bilirubin: 0.8 mg/dL (ref 0.3–1.2)
Total Protein: 6.5 g/dL (ref 6.5–8.1)

## 2017-07-18 LAB — CBC
HCT: 43.7 % (ref 39.0–52.0)
Hemoglobin: 14.2 g/dL (ref 13.0–17.0)
MCH: 28.6 pg (ref 26.0–34.0)
MCHC: 32.5 g/dL (ref 30.0–36.0)
MCV: 87.9 fL (ref 78.0–100.0)
Platelets: 252 10*3/uL (ref 150–400)
RBC: 4.97 MIL/uL (ref 4.22–5.81)
RDW: 16.3 % — ABNORMAL HIGH (ref 11.5–15.5)
WBC: 6.3 10*3/uL (ref 4.0–10.5)

## 2017-07-18 LAB — LIPASE, BLOOD: Lipase: 26 U/L (ref 11–51)

## 2017-07-18 MED ORDER — HYDROMORPHONE HCL 1 MG/ML IJ SOLN
0.5000 mg | INTRAMUSCULAR | Status: DC | PRN
  Administered 2017-07-18 – 2017-07-19 (×6): 0.5 mg via INTRAVENOUS
  Filled 2017-07-18 (×6): qty 1

## 2017-07-18 NOTE — Progress Notes (Signed)
Subjective:  [name] was seen sitting up in bed this morning. He states that he was able to get a little bit of rest last night. He reports improvement in his abdominal pain and that the frequency of his bloody emesis is decreased. He was able to tolerate a small amount of PO intake yesterday and did walk around the floor yesterday. Endorses mild lightheadedness and dizziness with standing. RN notified me that he does not think patient is throwing up blood, but rather coughing up the blood.  Patient also stated that he had an episode of sharp chest pain yesterday that was initially left sided, then became right sided. Associated with left hand tingling, SOB, and diaphoresis. Patient did state that he felt anxious when that occurred. He has never had this intensity of chest pain before. No family history of MI. Patient does smoke ~3-4 cigarettes daily. He denies current chest pain.  Objective:  Vital signs in last 24 hours: Vitals:   07/17/17 0524 07/17/17 1305 07/17/17 2004 07/18/17 0417  BP: 123/89 (!) 127/92 106/66 106/72  Pulse: 64 99 (!) 101 84  Resp: 17 18 17 18   Temp: 97.8 F (36.6 C) 97.8 F (36.6 C) 98.4 F (36.9 C) 98.4 F (36.9 C)  TempSrc: Oral Oral Oral Oral  SpO2: 98% 100% 99% 98%  Weight:      Height:       GEN: Well-appearing, young male sitting up in bed. intermittently grimaces with pain but in NAD. Alert and oriented. HENT: [place]/AT. Moist mucous membranes. No visible lesions. EYES: PERRL. Sclera non-icteric. Conjunctiva clear. RESP: Clear to auscultation bilaterally. No wheezes, rales, or rhonchi. No increased work of breathing. CV: Normal rate and regular rhythm. No murmurs, gallops, or rubs. No LE edema. Soreness to palpation of anterior chest wall. ABD: Soft. TTP in epigastric and LUQ. No rebound or guarding. Non-distended. Normoactive bowel sounds. Well-healed midline abdominal scar. EXT: No edema. Warm and well perfused. Capillary refill <2sec. NEURO:  Cranial nerves II-XII grossly intact. Able to lift all four extremities against gravity. No apparent audiovisual hallucinations. Speech fluent and appropriate. PSYCH: Patient is less anxious and pleasant. Appropriate affect. Well-groomed; speech is appropriate and on-subject.  Assessment/Plan:  Principal Problem:   Gastritis with hemorrhage Active Problems:   Hematemesis   Epigastric pain  [name] is a 32yo male with PMH of prior ex-lap secondary to work accident in 2017 who presents with epigastric/LUQ abdominal pain, hematemesis, and melena in the setting of increased NSAID use, concerning for upper GI bleed. EGD 2/20 without area of discrete bleeding. Abdominal pain and hematemesis thought to be secondary to gastropathy, secondary to increased NSAID and alcohol use. Alternatively, could be ENT etiology, although that would not explain his significant abdominal pain. He does report that his abdominal pain and hematemesis are improving, so we will continue conservative management.  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 Reports improvement in abdominal pain and decreased frequency of hematemesis. Normal lipase, CMP wnl. Hb stable, 13.6 this morning. Patient endorses lightheadedness and dizziness with exertion, likely related to hypovolemia in setting of decreased oral intake. Denies palpitations. Weaning IV pain medication today. - I/Os - Decrease to IV dilaudid 0.5mg  q4h PRN - PO norco 5-325 q4h PRN for pain control - IV Protonix 40mg  BID - CLD, advance to regular diet as tolerated - IV zofran 8mg  TID PRN - CBC, CMP, lipase in AM to assess for other possible etiologies - Transfuse Hb <7 -  Will d/c with protonix 40mg  PO daily for 2 weeks and f/u with GI as needed - Will set up appointment in IMTS clinic to establish care on d/c  Chest Pain 10 seconds of sharp chest pain yesterday that resolved on its own. Most likely related to anxiety,  however will check EKG and CXR to further evaluate. - EKG - CXR  Anxiety Surrounding his hospitalization after having significant surgery in the past for a work accident. Stated today that he does sometimes feel anxious outside of the hospital. Offered either starting anxiety medication in the hospital vs re-assessment as an outpatient in IMTS clinic. He opted for outpatient f/u. - Continue to monitor - Outpatient PCP f/u for continued care  Dispo: Anticipated discharge in approximately 1-2 day(s).  Scherrie Gerlach, MD 07/18/2017, 7:24 AM Pager: Demetrius Charity [phone]

## 2017-07-19 DIAGNOSIS — K3189 Other diseases of stomach and duodenum: Secondary | ICD-10-CM

## 2017-07-19 DIAGNOSIS — M62830 Muscle spasm of back: Secondary | ICD-10-CM

## 2017-07-19 MED ORDER — ONDANSETRON HCL 4 MG PO TABS: 4.0000 mg | ORAL_TABLET | Freq: Three times a day (TID) | ORAL | 0 refills | Status: AC | PRN

## 2017-07-19 MED ORDER — HYDROMORPHONE HCL 1 MG/ML IJ SOLN
0.5000 mg | Freq: Four times a day (QID) | INTRAMUSCULAR | Status: DC | PRN
  Administered 2017-07-19 (×2): 0.5 mg via INTRAVENOUS
  Filled 2017-07-19 (×2): qty 1

## 2017-07-19 MED ORDER — OXYCODONE HCL 5 MG PO TABS: 5.0000 mg | ORAL_TABLET | Freq: Four times a day (QID) | ORAL | 0 refills | Status: DC | PRN

## 2017-07-19 MED ORDER — PANTOPRAZOLE SODIUM 40 MG PO TBEC: 40.0000 mg | DELAYED_RELEASE_TABLET | Freq: Every day | ORAL | 0 refills | Status: DC

## 2017-07-19 MED ORDER — CYCLOBENZAPRINE HCL 5 MG PO TABS: 5.0000 mg | ORAL_TABLET | Freq: Three times a day (TID) | ORAL | 0 refills | Status: DC | PRN

## 2017-07-19 MED ORDER — ACETAMINOPHEN 325 MG PO TABS: 650.0000 mg | ORAL_TABLET | Freq: Four times a day (QID) | ORAL | 0 refills | Status: DC | PRN

## 2017-07-19 NOTE — Progress Notes (Signed)
Pt reports he vomited blood twice during night. First emesis was flushed by patient before RN could observe, but second time a small amount of bright red blood was observed in toilet.  Also, shortly after patient received Dilaudid at 0211, he went outside after being told he needed to stay on the floor. He agreed, but left anyway. When he returned from outside, again he was told not to leave the floor for his own safety. He says he will not.
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
## 2017-07-19 NOTE — Progress Notes (Signed)
Pt awaiting ride home [place]

## 2017-07-19 NOTE — Progress Notes (Signed)
[name] ride fell through but his boss is getting him uber to hotel.  Awaiting ride.

## 2017-07-19 NOTE — Progress Notes (Signed)
Patient left hospital before RN could D/C his saline lock. Patient was given his discharge instructions earlier.

## 2017-07-20 ENCOUNTER — Telehealth: Payer: Self-pay

## 2017-07-20 NOTE — Telephone Encounter (Signed)
HFU TOC per dr huang, discharge 07/19/2017. 

## 2017-07-24 ENCOUNTER — Ambulatory Visit: Payer: Self-pay

## 2017-07-28 ENCOUNTER — Inpatient Hospital Stay (HOSPITAL_COMMUNITY)
Admission: EM | Admit: 2017-07-28 | Discharge: 2017-07-30 | DRG: 379 | Discharge status: Against Medical Advice | Payer: Self-pay | Attending: Family Medicine | Admitting: Family Medicine

## 2017-07-28 ENCOUNTER — Other Ambulatory Visit: Payer: Self-pay

## 2017-07-28 ENCOUNTER — Encounter (HOSPITAL_COMMUNITY): Payer: Self-pay | Admitting: Emergency Medicine

## 2017-07-28 ENCOUNTER — Emergency Department (HOSPITAL_COMMUNITY): Payer: Self-pay

## 2017-07-28 DIAGNOSIS — Z5321 Procedure and treatment not carried out due to patient leaving prior to being seen by health care provider: Secondary | ICD-10-CM | POA: Diagnosis not present

## 2017-07-28 DIAGNOSIS — R402413 Glasgow coma scale score 13-15, at hospital admission: Secondary | ICD-10-CM | POA: Diagnosis present

## 2017-07-28 DIAGNOSIS — Y92410 Unspecified street and highway as the place of occurrence of the external cause: Secondary | ICD-10-CM

## 2017-07-28 DIAGNOSIS — F17210 Nicotine dependence, cigarettes, uncomplicated: Secondary | ICD-10-CM | POA: Diagnosis present

## 2017-07-28 DIAGNOSIS — K920 Hematemesis: Principal | ICD-10-CM | POA: Diagnosis present

## 2017-07-28 DIAGNOSIS — K2901 Acute gastritis with bleeding: Secondary | ICD-10-CM

## 2017-07-28 DIAGNOSIS — K922 Gastrointestinal hemorrhage, unspecified: Secondary | ICD-10-CM | POA: Diagnosis present

## 2017-07-28 DIAGNOSIS — Z8659 Personal history of other mental and behavioral disorders: Secondary | ICD-10-CM

## 2017-07-28 DIAGNOSIS — Z8719 Personal history of other diseases of the digestive system: Secondary | ICD-10-CM

## 2017-07-28 DIAGNOSIS — Z79899 Other long term (current) drug therapy: Secondary | ICD-10-CM

## 2017-07-28 LAB — URINALYSIS, ROUTINE W REFLEX MICROSCOPIC
BILIRUBIN URINE: NEGATIVE
Glucose, UA: NEGATIVE mg/dL
Hgb urine dipstick: NEGATIVE
KETONES UR: NEGATIVE mg/dL
LEUKOCYTES UA: NEGATIVE
Nitrite: NEGATIVE
PH: 6 (ref 5.0–8.0)
Protein, ur: NEGATIVE mg/dL

## 2017-07-28 LAB — COMPREHENSIVE METABOLIC PANEL
ALK PHOS: 99 U/L (ref 38–126)
ALT: 16 U/L — ABNORMAL LOW (ref 17–63)
ANION GAP: 9 (ref 5–15)
AST: 23 U/L (ref 15–41)
Albumin: 4.9 g/dL (ref 3.5–5.0)
BILIRUBIN TOTAL: 0.7 mg/dL (ref 0.3–1.2)
BUN: 9 mg/dL (ref 6–20)
CALCIUM: 9.6 mg/dL (ref 8.9–10.3)
CO2: 29 mmol/L (ref 22–32)
Chloride: 103 mmol/L (ref 101–111)
Creatinine, Ser: 0.87 mg/dL (ref 0.61–1.24)
GFR calc non Af Amer: >60 mL/min (ref >60)
Glucose, Bld: 90 mg/dL (ref 65–99)
Potassium: 4.5 mmol/L (ref 3.5–5.1)
Sodium: 141 mmol/L (ref 135–145)
TOTAL PROTEIN: 8.3 g/dL — AB (ref 6.5–8.1)

## 2017-07-28 LAB — I-STAT CHEM 8, ED
BUN: 8 mg/dL (ref 6–20)
Calcium, Ion: 1.14 mmol/L — ABNORMAL LOW (ref 1.15–1.40)
Chloride: 102 mmol/L (ref 101–111)
Creatinine, Ser: 0.8 mg/dL (ref 0.61–1.24)
GLUCOSE: 90 mg/dL (ref 65–99)
HCT: 44 % (ref 39.0–52.0)
Hemoglobin: 15 g/dL (ref 13.0–17.0)
Potassium: 3.9 mmol/L (ref 3.5–5.1)
SODIUM: 140 mmol/L (ref 135–145)
TCO2: 27 mmol/L (ref 22–32)

## 2017-07-28 LAB — PROTIME-INR
INR: 0.88
PROTHROMBIN TIME: 11.8 s (ref 11.4–15.2)

## 2017-07-28 LAB — CBC
HCT: 44.9 % (ref 39.0–52.0)
HEMOGLOBIN: 15 g/dL (ref 13.0–17.0)
MCH: 29.2 pg (ref 26.0–34.0)
MCHC: 33.4 g/dL (ref 30.0–36.0)
MCV: 87.5 fL (ref 78.0–100.0)
PLATELETS: 310 10*3/uL (ref 150–400)
RBC: 5.13 MIL/uL (ref 4.22–5.81)
RDW: 15.6 % — ABNORMAL HIGH (ref 11.5–15.5)
WBC: 9.1 10*3/uL (ref 4.0–10.5)

## 2017-07-28 LAB — TYPE AND SCREEN
ABO/RH(D): O POS
Antibody Screen: NEGATIVE

## 2017-07-28 LAB — ABO/RH: ABO/RH(D): O POS

## 2017-07-28 LAB — ETHANOL

## 2017-07-28 MED ORDER — PANTOPRAZOLE SODIUM 40 MG IV SOLR
80.0000 mg | Freq: Once | INTRAVENOUS | Status: AC
  Administered 2017-07-28: 80 mg via INTRAVENOUS
  Filled 2017-07-28: qty 80

## 2017-07-28 MED ORDER — IOPAMIDOL (ISOVUE-300) INJECTION 61%
INTRAVENOUS | Status: AC
  Administered 2017-07-28: 100 mL
  Filled 2017-07-28: qty 100

## 2017-07-28 MED ORDER — OXYCODONE HCL 5 MG PO TABS
5.0000 mg | ORAL_TABLET | Freq: Four times a day (QID) | ORAL | Status: DC | PRN
  Administered 2017-07-29 (×3): 5 mg via ORAL
  Filled 2017-07-28 (×3): qty 1

## 2017-07-28 MED ORDER — HYDROMORPHONE HCL 1 MG/ML IJ SOLN
1.0000 mg | Freq: Once | INTRAMUSCULAR | Status: AC
  Administered 2017-07-28: 1 mg via INTRAVENOUS
  Filled 2017-07-28: qty 1

## 2017-07-28 MED ORDER — MORPHINE SULFATE (PF) 2 MG/ML IV SOLN: 2.0000 mg | INTRAVENOUS | Status: DC | PRN

## 2017-07-28 MED ORDER — METOCLOPRAMIDE HCL 5 MG/ML IJ SOLN
5.0000 mg | Freq: Four times a day (QID) | INTRAMUSCULAR | Status: DC
  Administered 2017-07-29: 5 mg via INTRAVENOUS
  Filled 2017-07-28 (×3): qty 2

## 2017-07-28 MED ORDER — ONDANSETRON HCL 4 MG PO TABS: 4.0000 mg | ORAL_TABLET | Freq: Three times a day (TID) | ORAL | Status: DC | PRN

## 2017-07-28 MED ORDER — LORAZEPAM 2 MG/ML IJ SOLN
1.0000 mg | Freq: Once | INTRAMUSCULAR | Status: AC
  Administered 2017-07-28: 1 mg via INTRAVENOUS
  Filled 2017-07-28: qty 1

## 2017-07-28 MED ORDER — LORAZEPAM 2 MG/ML IJ SOLN
2.0000 mg | Freq: Once | INTRAMUSCULAR | Status: AC
  Administered 2017-07-28: 2 mg via INTRAVENOUS
  Filled 2017-07-28: qty 1

## 2017-07-28 MED ORDER — ONDANSETRON HCL 4 MG/2ML IJ SOLN
4.0000 mg | Freq: Four times a day (QID) | INTRAMUSCULAR | Status: DC | PRN
Start: 2017-07-28 — End: 2017-07-30
  Administered 2017-07-29 (×2): 4 mg via INTRAVENOUS
  Filled 2017-07-28 (×2): qty 2

## 2017-07-28 MED ORDER — ONDANSETRON HCL 4 MG PO TABS: 4.0000 mg | ORAL_TABLET | Freq: Four times a day (QID) | ORAL | Status: DC | PRN

## 2017-07-28 MED ORDER — ONDANSETRON HCL 4 MG/2ML IJ SOLN
4.0000 mg | Freq: Once | INTRAMUSCULAR | Status: AC
  Administered 2017-07-28: 4 mg via INTRAVENOUS
  Filled 2017-07-28: qty 2

## 2017-07-28 MED ORDER — ACETAMINOPHEN 325 MG PO TABS: 650.0000 mg | ORAL_TABLET | Freq: Four times a day (QID) | ORAL | Status: DC | PRN

## 2017-07-28 MED ORDER — HYDROMORPHONE HCL 1 MG/ML IJ SOLN
0.5000 mg | Freq: Once | INTRAMUSCULAR | Status: DC
  Filled 2017-07-28: qty 1

## 2017-07-28 MED ORDER — ACETAMINOPHEN 650 MG RE SUPP: 650.0000 mg | Freq: Four times a day (QID) | RECTAL | Status: DC | PRN

## 2017-07-28 MED ORDER — LORAZEPAM 2 MG/ML IJ SOLN
1.0000 mg | Freq: Four times a day (QID) | INTRAMUSCULAR | Status: DC | PRN
  Administered 2017-07-29 – 2017-07-30 (×4): 1 mg via INTRAVENOUS
  Filled 2017-07-28 (×5): qty 1

## 2017-07-28 MED ORDER — HYDROMORPHONE HCL 1 MG/ML IJ SOLN
0.5000 mg | Freq: Once | INTRAMUSCULAR | Status: AC
  Administered 2017-07-28: 0.5 mg via INTRAVENOUS
  Filled 2017-07-28: qty 1

## 2017-07-28 MED ORDER — PANTOPRAZOLE SODIUM 40 MG IV SOLR
40.0000 mg | Freq: Two times a day (BID) | INTRAVENOUS | Status: DC
  Administered 2017-07-28: 40 mg via INTRAVENOUS
  Filled 2017-07-28: qty 40

## 2017-07-28 MED ORDER — MORPHINE SULFATE (PF) 4 MG/ML IV SOLN
4.0000 mg | Freq: Once | INTRAVENOUS | Status: DC
  Filled 2017-07-28: qty 1

## 2017-07-28 MED ORDER — ONDANSETRON HCL 4 MG/2ML IJ SOLN
4.0000 mg | Freq: Once | INTRAMUSCULAR | Status: DC
  Filled 2017-07-28: qty 2

## 2017-07-28 NOTE — ED Notes (Signed)
Patient requesting pain medicine, pharmacy called to verify pain meds. Dylan Carrillo stated he would have the pharmacist review them.
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
## 2017-07-28 NOTE — Telephone Encounter (Signed)
Attempted to contact patient for transition of care call-number invalid.  Pt missed HFU appt that was scheduled for 03/01, but has been rescheduled for 03/11.  Will update pt's telephone number at that time.Dylan SpittleGoldston, Darlene Cassady3/5/201911:17 AM

## 2017-07-28 NOTE — ED Notes (Signed)
Patient seems confused at times. Pt has made statements to this RN and [place]. Pt stated he would "fix the bed for $15" and he also mentioned "removing all this stuff in here" as he touched his abdomen. When asked what he meant by that he mentioned hearing a voice. This nurse asked pt if he was hearing voices and pt replied " I talked with Dylan Carrillo about that earlier".
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
## 2017-07-28 NOTE — ED Triage Notes (Signed)
Pt verbalizes throwing up blood; seen past week for same. "It started again today."

## 2017-07-28 NOTE — ED Notes (Addendum)
Patient is awake and out of bed again, restless and c/o pain. Patient unable to take prn morphine due to previous reaction. hospitalist paged, waiting on response.  Safety sitter is at bedside.
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
## 2017-07-28 NOTE — ED Notes (Signed)
Patient transported to CT 

## 2017-07-28 NOTE — ED Notes (Signed)
Pt states that if he takes reglan, then he feels restless and would prefer not to take medication.
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
## 2017-07-29 ENCOUNTER — Encounter (HOSPITAL_COMMUNITY): Payer: Self-pay | Admitting: *Deleted

## 2017-07-29 ENCOUNTER — Other Ambulatory Visit: Payer: Self-pay

## 2017-07-29 ENCOUNTER — Encounter (HOSPITAL_COMMUNITY)
Admission: EM | Discharge status: Against Medical Advice | Payer: Self-pay | Source: Home / Self Care | Attending: Internal Medicine

## 2017-07-29 ENCOUNTER — Inpatient Hospital Stay (HOSPITAL_COMMUNITY): Payer: Self-pay | Admitting: Certified Registered Nurse Anesthetist

## 2017-07-29 DIAGNOSIS — R112 Nausea with vomiting, unspecified: Secondary | ICD-10-CM

## 2017-07-29 HISTORY — PX: ESOPHAGOGASTRODUODENOSCOPY (EGD) WITH PROPOFOL: SHX5813

## 2017-07-29 LAB — COMPREHENSIVE METABOLIC PANEL
ALK PHOS: 65 U/L (ref 38–126)
ALT: 13 U/L — AB (ref 17–63)
AST: 18 U/L (ref 15–41)
Albumin: 3.7 g/dL (ref 3.5–5.0)
Anion gap: 7 (ref 5–15)
BUN: 9 mg/dL (ref 6–20)
CALCIUM: 8.6 mg/dL — AB (ref 8.9–10.3)
CO2: 28 mmol/L (ref 22–32)
CREATININE: 0.94 mg/dL (ref 0.61–1.24)
Chloride: 108 mmol/L (ref 101–111)
Glucose, Bld: 93 mg/dL (ref 65–99)
Potassium: 3.9 mmol/L (ref 3.5–5.1)
Sodium: 143 mmol/L (ref 135–145)
Total Bilirubin: 0.9 mg/dL (ref 0.3–1.2)
Total Protein: 6.2 g/dL — ABNORMAL LOW (ref 6.5–8.1)

## 2017-07-29 LAB — PROTIME-INR
INR: 0.94
PROTHROMBIN TIME: 12.5 s (ref 11.4–15.2)

## 2017-07-29 LAB — RAPID URINE DRUG SCREEN, HOSP PERFORMED
Amphetamines: NOT DETECTED
BENZODIAZEPINES: NOT DETECTED
Barbiturates: NOT DETECTED
Cocaine: NOT DETECTED
OPIATES: POSITIVE — AB
Tetrahydrocannabinol: POSITIVE — AB

## 2017-07-29 LAB — HEMOGLOBIN AND HEMATOCRIT, BLOOD
HCT: 40.3 % (ref 39.0–52.0)
HEMATOCRIT: 37.8 % — AB (ref 39.0–52.0)
HEMOGLOBIN: 12 g/dL — AB (ref 13.0–17.0)
Hemoglobin: 12.8 g/dL — ABNORMAL LOW (ref 13.0–17.0)

## 2017-07-29 LAB — AMMONIA: Ammonia: 21 umol/L (ref 9–35)

## 2017-07-29 LAB — MRSA PCR SCREENING: MRSA BY PCR: NEGATIVE

## 2017-07-29 SURGERY — ESOPHAGOGASTRODUODENOSCOPY (EGD) WITH PROPOFOL
Anesthesia: Monitor Anesthesia Care

## 2017-07-29 MED ORDER — HYDROMORPHONE HCL 1 MG/ML IJ SOLN
1.0000 mg | Freq: Once | INTRAMUSCULAR | Status: AC
  Administered 2017-07-29: 1 mg via INTRAVENOUS
  Filled 2017-07-29: qty 1

## 2017-07-29 MED ORDER — SODIUM CHLORIDE 0.9 % IV SOLN: INTRAVENOUS | Status: DC

## 2017-07-29 MED ORDER — LORAZEPAM 2 MG/ML IJ SOLN
1.0000 mg | Freq: Once | INTRAMUSCULAR | Status: AC
  Administered 2017-07-29: 1 mg via INTRAVENOUS

## 2017-07-29 MED ORDER — HALOPERIDOL LACTATE 5 MG/ML IJ SOLN
2.5000 mg | Freq: Once | INTRAMUSCULAR | Status: AC
  Administered 2017-07-29: 2.5 mg via INTRAVENOUS
  Filled 2017-07-29: qty 1

## 2017-07-29 MED ORDER — PANTOPRAZOLE SODIUM 40 MG IV SOLR: 80.0000 mg | Freq: Once | INTRAVENOUS | Status: DC

## 2017-07-29 MED ORDER — ONDANSETRON HCL 4 MG/2ML IJ SOLN
INTRAMUSCULAR | Status: DC | PRN
  Administered 2017-07-29: 4 mg via INTRAVENOUS

## 2017-07-29 MED ORDER — PANTOPRAZOLE SODIUM 40 MG IV SOLR: 40.0000 mg | Freq: Two times a day (BID) | INTRAVENOUS | Status: DC

## 2017-07-29 MED ORDER — PROPOFOL 500 MG/50ML IV EMUL
INTRAVENOUS | Status: DC | PRN
  Administered 2017-07-29: 100 ug/kg/min via INTRAVENOUS

## 2017-07-29 MED ORDER — MORPHINE SULFATE (PF) 4 MG/ML IV SOLN
2.0000 mg | INTRAVENOUS | Status: DC | PRN
  Administered 2017-07-29 – 2017-07-30 (×7): 2 mg via INTRAVENOUS
  Filled 2017-07-29 (×8): qty 1

## 2017-07-29 MED ORDER — PROPOFOL 10 MG/ML IV BOLUS
INTRAVENOUS | Status: DC | PRN
  Administered 2017-07-29: 30 mg via INTRAVENOUS
  Administered 2017-07-29: 40 mg via INTRAVENOUS

## 2017-07-29 MED ORDER — CYCLOBENZAPRINE HCL 5 MG PO TABS: 5.0000 mg | ORAL_TABLET | Freq: Three times a day (TID) | ORAL | Status: DC | PRN

## 2017-07-29 MED ORDER — LACTATED RINGERS IV SOLN
INTRAVENOUS | Status: DC
  Administered 2017-07-29: 13:00:00 via INTRAVENOUS

## 2017-07-29 MED ORDER — OXYMETAZOLINE HCL 0.05 % NA SOLN
1.0000 | Freq: Two times a day (BID) | NASAL | Status: DC
  Administered 2017-07-30: 1 via NASAL
  Filled 2017-07-29: qty 15

## 2017-07-29 MED ORDER — PANTOPRAZOLE SODIUM 40 MG PO TBEC
40.0000 mg | DELAYED_RELEASE_TABLET | Freq: Two times a day (BID) | ORAL | Status: DC
  Administered 2017-07-29 – 2017-07-30 (×2): 40 mg via ORAL
  Filled 2017-07-29 (×2): qty 1

## 2017-07-29 MED ORDER — PROPOFOL 10 MG/ML IV BOLUS
INTRAVENOUS | Status: AC
  Filled 2017-07-29: qty 40

## 2017-07-29 MED ORDER — DEXTROSE-NACL 5-0.9 % IV SOLN
INTRAVENOUS | Status: DC
  Administered 2017-07-29 (×2): via INTRAVENOUS
  Administered 2017-07-30: 75 mL/h via INTRAVENOUS
  Filled 2017-07-29: qty 1000

## 2017-07-29 MED ORDER — CYCLOBENZAPRINE HCL 5 MG PO TABS
5.0000 mg | ORAL_TABLET | Freq: Three times a day (TID) | ORAL | Status: DC | PRN
  Administered 2017-07-30: 5 mg via ORAL
  Filled 2017-07-29: qty 1

## 2017-07-29 MED ORDER — SODIUM CHLORIDE 0.9 % IV SOLN
8.0000 mg/h | INTRAVENOUS | Status: DC
  Administered 2017-07-29: 8 mg/h via INTRAVENOUS
  Filled 2017-07-29 (×2): qty 80

## 2017-07-29 SURGICAL SUPPLY — 14 items

## 2017-07-29 NOTE — Anesthesia Procedure Notes (Signed)
Procedure Name: MAC Date/Time: 07/29/2017 1:10 PM Performed by: West Pugh, CRNA Pre-anesthesia Checklist: Patient identified, Emergency Drugs available, Suction available, Patient being monitored and Timeout performed Oxygen Delivery Method: Simple face mask Placement Confirmation: positive ETCO2 Dental Injury: Teeth and Oropharynx as per pre-operative assessment

## 2017-07-29 NOTE — Interval H&P Note (Signed)
History and Physical Interval Note:  07/29/2017 12:31 PM  Dylan Carrillo  has presented today for surgery, with the diagnosis of Hematemesis  The various methods of treatment have been discussed with the patient and family. After consideration of risks, benefits and other options for treatment, the patient has consented to  Procedure(s): ESOPHAGOGASTRODUODENOSCOPY (EGD) WITH PROPOFOL (N/A) as a surgical intervention .  The patient's history has been reviewed, patient examined, no change in status, stable for surgery.  I have reviewed the patient's chart and labs.  Questions were answered to the patient's satisfaction.     Chavez Rosol

## 2017-07-29 NOTE — Anesthesia Postprocedure Evaluation (Signed)
Anesthesia Post Note  Patient: Dylan Carrillo  Procedure(s) Performed: ESOPHAGOGASTRODUODENOSCOPY (EGD) WITH PROPOFOL (N/A )     Patient location during evaluation: PACU Anesthesia Type: MAC Level of consciousness: awake and alert Pain management: pain level controlled Vital Signs Assessment: post-procedure vital signs reviewed and stable Respiratory status: spontaneous breathing, nonlabored ventilation, respiratory function stable and patient connected to nasal cannula oxygen Cardiovascular status: stable and blood pressure returned to baseline Postop Assessment: no apparent nausea or vomiting Anesthetic complications: no    Last Vitals:  Vitals:   07/29/17 1345 07/29/17 1350  BP: 102/73 (!) 98/59  Pulse: 60 81  Resp: (!) 30 (!) 21  Temp:    SpO2: 98% 98%    Last Pain:  Vitals:   07/29/17 1326  TempSrc: Oral  PainSc:                  Dylan Carrillo P Dylan Carrillo
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
## 2017-07-29 NOTE — ED Notes (Signed)
Attempted to call report and RN was in another room. She will call back.

## 2017-07-29 NOTE — ED Notes (Signed)
Patient refusing to wear cardiac leads.

## 2017-07-29 NOTE — Anesthesia Preprocedure Evaluation (Addendum)
Anesthesia Evaluation  Patient identified by MRN, date of birth, ID band Patient awake    Reviewed: Allergy & Precautions, NPO status , Patient's Chart, lab work & pertinent test results  Airway Mallampati: II  TM Distance: >3 FB Neck ROM: Full    Dental  (+) Partial Upper, Missing   Pulmonary Current Smoker,    Pulmonary exam normal breath sounds clear to auscultation       Cardiovascular negative cardio ROS Normal cardiovascular exam Rhythm:Regular Rate:Normal  ECG: NSR, rate 89   Neuro/Psych negative neurological ROS  negative psych ROS   GI/Hepatic negative GI ROS, Neg liver ROS,   Endo/Other  negative endocrine ROS  Renal/GU negative Renal ROS     Musculoskeletal negative musculoskeletal ROS (+)   Abdominal   Peds  Hematology  (+) anemia ,   Anesthesia Other Findings Hematemesis  Reproductive/Obstetrics                            Anesthesia Physical Anesthesia Plan  ASA: II  Anesthesia Plan: MAC   Post-op Pain Management:    Induction: Intravenous  PONV Risk Score and Plan: 1 and Treatment may vary due to age or medical condition and Propofol infusion  Airway Management Planned: Natural Airway and Nasal Cannula  Additional Equipment:   Intra-op Plan:   Post-operative Plan:   Informed Consent: I have reviewed the patients History and Physical, chart, labs and discussed the procedure including the risks, benefits and alternatives for the proposed anesthesia with the patient or authorized representative who has indicated his/her understanding and acceptance.   Dental advisory given  Plan Discussed with: CRNA  Anesthesia Plan Comments:         Anesthesia Quick Evaluation
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
## 2017-07-29 NOTE — ED Notes (Addendum)
Patient out of bed again, trying to put clothes on. He has taken clothes out of belonging bags and put them back in multiple times.  Sitter reports pt was washing his hands and kept activation paper towel dispenser until paper towels were piling up on the floor. Sitter asked pt to stop and use only what was necessary. Patient then walked out of the room and asked another staff member for a roll of toilet paper because "there wasn't any toilet paper in room 133".  When this RN entered the room patient was very anxious and defensive, stating we were telling lies on him. Patient asking to sign AMA. This RN stated I would page the doctor for him. Hospitalist notified.

## 2017-07-29 NOTE — Op Note (Signed)
[facility] Patient Name: Dylan Carrillo Procedure Date: 07/29/2017 MRN: [id number] Attending MD: Dylan Carrillo , MD Date of Birth: 29-Apr-1986 CSN: [id number] Age: 32 Admit Type: Inpatient Procedure:                Upper GI endoscopy Indications:              Hematemesis Providers:                Dylan Form, MD, Norman Clay, RN, Margo Aye, Technician, Kym Groom, CRNA Referring MD:              Medicines:                Monitored Anesthesia Care Complications:            No immediate complications. Estimated Blood Loss:     Estimated blood loss: none. Procedure:                Pre-Anesthesia Assessment:                           - Prior to the procedure, a History and Physical                            was performed, and patient medications and                            allergies were reviewed. The patient's tolerance of                            previous anesthesia was also reviewed. The risks                            and benefits of the procedure and the sedation                            options and risks were discussed with the patient.                            All questions were answered, and informed consent                            was obtained. Prior Anticoagulants: The patient has                            taken no previous anticoagulant or antiplatelet                            agents. ASA Grade Assessment: II - A patient with                            mild systemic disease. After reviewing the risks  and benefits, the patient was deemed in                            satisfactory condition to undergo the procedure.                           After obtaining informed consent, the endoscope was                            passed under direct vision. Throughout the                            procedure, the patient's blood pressure, pulse, and                            oxygen  saturations were monitored continuously. The                            ([id number]) [id number] was introduced through the                            mouth, and advanced to the second part of duodenum.                            The upper GI endoscopy was accomplished without                            difficulty. The patient tolerated the procedure                            well. Scope In: Scope Out: Findings:      The esophagus was normal.      The stomach was normal.      The examined duodenum was normal. Impression:               - Normal esophagus.                           - Normal stomach.                           - Normal examined duodenum.                           - No specimens collected.                           - No source to explain upper GI bleed. Moderate Sedation:      N/A- Per Anesthesia Care Recommendation:           - Patient has a contact number available for                            emergencies. The signs and symptoms of potential  delayed complications were discussed with the                            patient. Return to normal activities tomorrow.                            Written discharge instructions were provided to the                            patient.                           - Resume previous diet.                           - Continue present medications.                           - No repeat upper endoscopy.                           - Please consult ENT if he has additional witnessed                            episodes of bleeding to exclude oropharyngeal or                            nasal bleed.                           - Will sign off, please call with any questions Procedure Code(s):        --- Professional ---                           [phone], Esophagogastroduodenoscopy, flexible,                            transoral; diagnostic, including collection of                            specimen(s) by brushing or  washing, when performed                            (separate procedure) Diagnosis Code(s):        --- Professional ---                           K92.0, Hematemesis CPT copyright 2016 [employer] Medical Association. All rights reserved. The codes documented in this report are preliminary and upon coder review may  be revised to meet current compliance requirements. Dylan FormKavitha V. Asiah Befort, MD 07/29/2017 1:38:47 PM This report has been signed electronically. Number of Addenda: 0

## 2017-07-29 NOTE — Transfer of Care (Signed)
Immediate Anesthesia Transfer of Care Note  Patient: Dylan Carrillo  Procedure(s) Performed: ESOPHAGOGASTRODUODENOSCOPY (EGD) WITH PROPOFOL (N/A )  Patient Location: PACU  Anesthesia Type:MAC  Level of Consciousness: awake and alert   Airway & Oxygen Therapy: Patient Spontanous Breathing and Patient connected to nasal cannula oxygen  Post-op Assessment: Report given to RN  Post vital signs: Reviewed and stable  Last Vitals:  Vitals:   07/29/17 1030 07/29/17 1230  BP: 120/67 109/67  Pulse:  64  Resp:  15  Temp: 36.5 C (!) 36.4 C  SpO2:  97%    Last Pain:  Vitals:   07/29/17 1230  TempSrc: Oral  PainSc:          Complications: No apparent anesthesia complications

## 2017-07-30 ENCOUNTER — Encounter (HOSPITAL_COMMUNITY): Payer: Self-pay

## 2017-07-30 ENCOUNTER — Other Ambulatory Visit: Payer: Self-pay

## 2017-07-30 ENCOUNTER — Inpatient Hospital Stay (HOSPITAL_COMMUNITY)
Admission: EM | Admit: 2017-07-30 | Discharge: 2017-08-01 | DRG: 379 | Disposition: A | Discharge status: Home / Self Care | Payer: Self-pay | Attending: Internal Medicine | Admitting: Internal Medicine

## 2017-07-30 ENCOUNTER — Emergency Department (HOSPITAL_COMMUNITY): Payer: Self-pay

## 2017-07-30 DIAGNOSIS — F1010 Alcohol abuse, uncomplicated: Secondary | ICD-10-CM

## 2017-07-30 DIAGNOSIS — K920 Hematemesis: Principal | ICD-10-CM | POA: Diagnosis present

## 2017-07-30 DIAGNOSIS — K2971 Gastritis, unspecified, with bleeding: Secondary | ICD-10-CM | POA: Diagnosis present

## 2017-07-30 DIAGNOSIS — R042 Hemoptysis: Secondary | ICD-10-CM | POA: Diagnosis present

## 2017-07-30 DIAGNOSIS — K922 Gastrointestinal hemorrhage, unspecified: Secondary | ICD-10-CM | POA: Diagnosis present

## 2017-07-30 DIAGNOSIS — R1013 Epigastric pain: Secondary | ICD-10-CM | POA: Diagnosis present

## 2017-07-30 DIAGNOSIS — R1084 Generalized abdominal pain: Secondary | ICD-10-CM

## 2017-07-30 DIAGNOSIS — Z79899 Other long term (current) drug therapy: Secondary | ICD-10-CM

## 2017-07-30 DIAGNOSIS — F1020 Alcohol dependence, uncomplicated: Secondary | ICD-10-CM | POA: Diagnosis present

## 2017-07-30 DIAGNOSIS — F17210 Nicotine dependence, cigarettes, uncomplicated: Secondary | ICD-10-CM | POA: Diagnosis present

## 2017-07-30 LAB — COMPREHENSIVE METABOLIC PANEL
ALBUMIN: 4.7 g/dL (ref 3.5–5.0)
ALK PHOS: 86 U/L (ref 38–126)
ALT: 17 U/L (ref 17–63)
ANION GAP: 13 (ref 5–15)
AST: 24 U/L (ref 15–41)
BILIRUBIN TOTAL: 1.1 mg/dL (ref 0.3–1.2)
BUN: 9 mg/dL (ref 6–20)
CALCIUM: 9.3 mg/dL (ref 8.9–10.3)
CO2: 23 mmol/L (ref 22–32)
Chloride: 102 mmol/L (ref 101–111)
Creatinine, Ser: 0.91 mg/dL (ref 0.61–1.24)
GFR calc Af Amer: >60 mL/min (ref >60)
GLUCOSE: 90 mg/dL (ref 65–99)
Potassium: 3.4 mmol/L — ABNORMAL LOW (ref 3.5–5.1)
Sodium: 138 mmol/L (ref 135–145)
TOTAL PROTEIN: 7.8 g/dL (ref 6.5–8.1)

## 2017-07-30 LAB — TYPE AND SCREEN
ABO/RH(D): O POS
ANTIBODY SCREEN: NEGATIVE

## 2017-07-30 LAB — HEMOGLOBIN AND HEMATOCRIT, BLOOD
HCT: 39.4 % (ref 39.0–52.0)
Hemoglobin: 12.6 g/dL — ABNORMAL LOW (ref 13.0–17.0)

## 2017-07-30 LAB — CBC
HCT: 44.2 % (ref 39.0–52.0)
HEMATOCRIT: 41.2 % (ref 39.0–52.0)
Hemoglobin: 14.6 g/dL (ref 13.0–17.0)
Hemoglobin: 13.3 g/dL (ref 13.0–17.0)
MCH: 29.1 pg (ref 26.0–34.0)
MCH: 28.4 pg (ref 26.0–34.0)
MCHC: 33 g/dL (ref 30.0–36.0)
MCHC: 32.3 g/dL (ref 30.0–36.0)
MCV: 88 fL (ref 78.0–100.0)
MCV: 88 fL (ref 78.0–100.0)
Platelets: 290 10*3/uL (ref 150–400)
Platelets: 271 10*3/uL (ref 150–400)
RBC: 5.02 MIL/uL (ref 4.22–5.81)
RBC: 4.68 MIL/uL (ref 4.22–5.81)
RDW: 15.3 % (ref 11.5–15.5)
RDW: 15.7 % — ABNORMAL HIGH (ref 11.5–15.5)
WBC: 8.8 10*3/uL (ref 4.0–10.5)
WBC: 6.4 10*3/uL (ref 4.0–10.5)

## 2017-07-30 LAB — LIPASE, BLOOD: Lipase: 26 U/L (ref 11–51)

## 2017-07-30 LAB — I-STAT CG4 LACTIC ACID, ED: Lactic Acid, Venous: 1.3 mmol/L (ref 0.5–1.9)

## 2017-07-30 MED ORDER — HYDROMORPHONE HCL 1 MG/ML IJ SOLN
1.0000 mg | Freq: Once | INTRAMUSCULAR | Status: AC
  Administered 2017-07-30: 1 mg via INTRAVENOUS
  Filled 2017-07-30: qty 1

## 2017-07-30 MED ORDER — HYDROMORPHONE HCL 1 MG/ML IJ SOLN
1.0000 mg | Freq: Once | INTRAMUSCULAR | Status: AC
Start: 2017-07-30 — End: 2017-07-30
  Administered 2017-07-30: 1 mg via INTRAVENOUS
  Filled 2017-07-30: qty 1

## 2017-07-30 MED ORDER — HYDROMORPHONE HCL 1 MG/ML IJ SOLN
0.5000 mg | Freq: Once | INTRAMUSCULAR | Status: AC
  Administered 2017-07-30: 0.5 mg via INTRAVENOUS
  Filled 2017-07-30: qty 0.5

## 2017-07-30 MED ORDER — ACETAMINOPHEN 325 MG PO TABS: 650.0000 mg | ORAL_TABLET | Freq: Four times a day (QID) | ORAL | Status: DC | PRN

## 2017-07-30 MED ORDER — HYDROMORPHONE HCL 1 MG/ML IJ SOLN
1.0000 mg | INTRAMUSCULAR | Status: DC | PRN
  Administered 2017-07-30 – 2017-08-01 (×12): 1 mg via INTRAVENOUS
  Filled 2017-07-30 (×12): qty 1

## 2017-07-30 MED ORDER — ONDANSETRON HCL 4 MG/2ML IJ SOLN: 4.0000 mg | Freq: Four times a day (QID) | INTRAMUSCULAR | Status: DC | PRN

## 2017-07-30 MED ORDER — SODIUM CHLORIDE 0.9 % IV SOLN
INTRAVENOUS | Status: DC
  Administered 2017-07-30: 22:00:00 via INTRAVENOUS

## 2017-07-30 MED ORDER — SODIUM CHLORIDE 0.9 % IV SOLN
INTRAVENOUS | Status: AC
  Administered 2017-07-30: 17:00:00 via INTRAVENOUS

## 2017-07-30 MED ORDER — BISACODYL 10 MG RE SUPP: 10.0000 mg | Freq: Every day | RECTAL | Status: DC | PRN

## 2017-07-30 MED ORDER — ACETAMINOPHEN 650 MG RE SUPP: 650.0000 mg | Freq: Four times a day (QID) | RECTAL | Status: DC | PRN

## 2017-07-30 MED ORDER — ONDANSETRON HCL 4 MG PO TABS: 4.0000 mg | ORAL_TABLET | Freq: Four times a day (QID) | ORAL | Status: DC | PRN

## 2017-07-30 MED ORDER — OXYMETAZOLINE HCL 0.05 % NA SOLN: 1.0000 | Freq: Two times a day (BID) | NASAL | Status: DC

## 2017-07-30 MED ORDER — ONDANSETRON HCL 4 MG/2ML IJ SOLN
4.0000 mg | Freq: Once | INTRAMUSCULAR | Status: AC
Start: 2017-07-30 — End: 2017-07-30
  Administered 2017-07-30: 4 mg via INTRAVENOUS
  Filled 2017-07-30: qty 2

## 2017-07-30 MED ORDER — PANTOPRAZOLE SODIUM 40 MG IV SOLR
40.0000 mg | Freq: Once | INTRAVENOUS | Status: AC
  Administered 2017-07-30: 40 mg via INTRAVENOUS
  Filled 2017-07-30: qty 40

## 2017-07-30 MED ORDER — OXYCODONE HCL 5 MG PO TABS: 10.0000 mg | ORAL_TABLET | Freq: Once | ORAL | Status: DC

## 2017-07-30 MED ORDER — SENNOSIDES-DOCUSATE SODIUM 8.6-50 MG PO TABS: 1.0000 | ORAL_TABLET | Freq: Every evening | ORAL | Status: DC | PRN

## 2017-07-30 MED ORDER — PANTOPRAZOLE SODIUM 40 MG IV SOLR
40.0000 mg | Freq: Two times a day (BID) | INTRAVENOUS | Status: DC
  Administered 2017-07-30 – 2017-07-31 (×2): 40 mg via INTRAVENOUS
  Filled 2017-07-30 (×2): qty 40

## 2017-07-30 NOTE — Progress Notes (Signed)
Dr. Edward JollySilva arrived to unit to assess pt after a text I sent to let him know pt frustrated & wanting to leave. Pt also continues to cough up small amounts of frank, red blood. No nausea nor distress noted. Pt got dressed in street clothes. Reassurance given that MD was coming, and to reconsider. Pt calmed down and waited to see Dr. Edward JollySilva. Md enter new orders into Epic. See chart.

## 2017-07-30 NOTE — ED Notes (Signed)
Attempted to check vital signs patient requested to finish his phone call. Will reassess.

## 2017-07-30 NOTE — ED Notes (Addendum)
Pt continues to take himself off the monitor.

## 2017-07-30 NOTE — ED Notes (Signed)
Pt placed clothes back on and walked out of room, this rn asked the patient what he was doing he said that he was cold and just wanted to warm up. Asked the patient to please put his clothes back on so we could place him back on the monitor.

## 2017-07-30 NOTE — H&P (Signed)
History and Physical    Dylan AmorRoberto Cagley AVW:[id number] DOB: 11/20/1985 DOA: 07/30/2017   PCP: Patient, No Pcp Per   Patient coming from:  Home    Chief Complaint: Hematemesis and abdominal pain   HPI: Dylan Carrillo is a 32 y.o. male with a history of alcohol abuse, NSAID induced gastritis, with prior open endoscopy on July 15, 2017, findings of no ulceration, discharged home on Protonix.  Patient returned to the hospital on 3/5, after having a trauma at work with associated hematemesis with bright red blood.  Repeat endoscopy  3/52019  with normal findings.  ENT for possible nasopharyngeal cause was to be arranged  but the patient left AMA.  He then returned again this afternoon,with hematemesis and epigastric pain and one episode of loose dark stools without further episodes here at the ER.  The patient denies any recent NSAID intake, but continues to drink, last binge last Sunday.  He denies any recreational drug use.  He denies any headaches, vision changes, or epistaxis.  He denies any gum bleed. He denies any shortness of breath, cough, chest pain or palpitations.  He reports some nausea, and his appetite has been significantly decreased.  He denies any dysuria or gross hematuria.  He denies any lower extremity swelling, or calf pain.  He does not take any other medications, or herbal products.  He denies any recent infections or sick contacts.   ED Course:  BP 113/84   Pulse (!) 104   Temp 98 F (36.7 C) (Oral)   Resp 16   Ht 5\' 6"  (1.676 m)   Wt 71.2 kg (157 lb)   SpO2 98%   BMI 25.34 kg/m   Lactic acid 1.3. White count 8.8, hemoglobin 14.6.  Platelets 190.  PT 12.5, INR 0.94. Chest x-ray NAD. Hemoccult negative. BUN 9. Of note, and his CT of the abdomen and pelvis on 07/28/2017 is negative for acute findings, other than known diverticulosis. His last EGD on 07/28/2017 showed normal esophagus, stomach, duodenum, no source to explain upper GI bleed.  Review of Systems:  As  per HPI otherwise all other systems reviewed and are negative  Past Medical History:  Diagnosis Date  . Hematemesis 07/14/2017    Past Surgical History:  Procedure Laterality Date  . ABDOMINAL SURGERY    . ESOPHAGOGASTRODUODENOSCOPY N/A 07/15/2017   Procedure: ESOPHAGOGASTRODUODENOSCOPY (EGD);  Surgeon: [name], [name], MD;  [facility];  Service: Gastroenterology;  Laterality: N/A;  . RIB FRACTURE SURGERY    . SHOULDER SURGERY      Social History Social History   Socioeconomic History  . Marital status: Legally Separated    Spouse name: Not on file  . Number of children: Not on file  . Years of education: Not on file  . Highest education level: Not on file  Social Needs  . Financial resource strain: Not on file  . Food insecurity - worry: Not on file  . Food insecurity - inability: Not on file  . Transportation needs - medical: Not on file  . Transportation needs - non-medical: Not on file  Occupational History  . Not on file  Tobacco Use  . Smoking status: Current Every Day Smoker    Years: 15.00    Types: Cigarettes  . Smokeless tobacco: Never Used  Substance and Sexual Activity  . Alcohol use: Yes    Comment: occasional  . Drug use: No  . Sexual activity: Not on file  Other Topics Concern  . Not  on file  Social History Narrative  . Not on file     No Known Allergies  History reviewed. No pertinent family history.    Prior to Admission medications   Medication Sig Start Date End Date Taking? Authorizing Provider  acetaminophen (TYLENOL) 325 MG tablet Take 2 tablets (650 mg total) by mouth every 6 (six) hours as needed for mild pain (or Fever >/= 101). 07/19/17   Scherrie Gerlach, MD  cyclobenzaprine (FLEXERIL) 5 MG tablet Take 1 tablet (5 mg total) by mouth every 8 (eight) hours as needed for muscle spasms. TAKE AT NIGHT. DO NOT TAKE BEFORE WORK OR AT WORK. 07/19/17   Scherrie Gerlach, MD  oxyCODONE (OXY IR/ROXICODONE) 5 MG immediate release  tablet Take 1 tablet (5 mg total) by mouth every 6 (six) hours as needed for severe pain. TAKE AT NIGHT. DO NOT TAKE BEFORE WORK OR AT WORK. 07/19/17   Scherrie Gerlach, MD  pantoprazole (PROTONIX) 40 MG tablet Take 1 tablet (40 mg total) by mouth daily for 14 days. 07/19/17 08/02/17  Scherrie Gerlach, MD    Physical Exam:  Vitals:   07/30/17 1445 07/30/17 1717 07/30/17 1728  BP: 116/85 113/84   Pulse: 84  (!) 104  Resp: 16    Temp: 98 F (36.7 C)    TempSrc: Oral    SpO2: 96%  98%  Weight: 71.2 kg (157 lb)    Height: 5\' 6"  (1.676 m)     Constitutional: NAD, but in significant discomfort due to hematemesis and pain issues Eyes: PERRL, lids and conjunctivae normal ENMT: Mucous membranes are moist, without exudate or lesions.  No gum bleeding nosebleeding Neck: normal, supple, no masses, no thyromegaly Respiratory: clear to auscultation bilaterally, no wheezing, no crackles. Normal respiratory effort  Cardiovascular: Regular rate and rhythm,  murmur, rubs or gallops. No extremity edema. 2+ pedal pulses. No carotid bruits.  Abdomen: Soft, somewhat tender in the upper abdomen, no discrete masses.  Several areas of well-healed scars above the umbilicus .no hepatosplenomegaly. Bowel sounds positive.  Musculoskeletal: no clubbing / cyanosis. Moves all extremities Skin: no jaundice, No lesions.  Neurologic: Sensation intact  Strength equal in all extremities Psychiatric:   Alert and oriented x 3..  She does    Labs on Admission: I have personally reviewed following labs and imaging studies  CBC: Recent Labs  Lab 07/28/17 1605 07/28/17 1624 07/29/17 0549 07/29/17 1126 07/30/17 0510 07/30/17 1642  WBC 9.1  --   --   --   --  8.8  HGB 15.0 15.0 12.0* 12.8* 12.6* 14.6  HCT 44.9 44.0 37.8* 40.3 39.4 44.2  MCV 87.5  --   --   --   --  88.0  PLT 310  --   --   --   --  290    Basic Metabolic Panel: Recent Labs  Lab 07/28/17 1605 07/28/17 1624 07/29/17 0549 07/30/17 1642  NA 141  140 143 138  K 4.5 3.9 3.9 3.4*  CL 103 102 108 102  CO2 29  --  28 23  GLUCOSE 90 90 93 90  BUN 9 8 9 9   CREATININE 0.87 0.80 0.94 0.91  CALCIUM 9.6  --  8.6* 9.3    GFR: Estimated Creatinine Clearance: 104.2 mL/min (by C-G formula based on SCr of 0.91 mg/dL).  Liver Function Tests: Recent Labs  Lab 07/28/17 1605 07/29/17 0549 07/30/17 1642  AST 23 18 24   ALT 16* 13* 17  ALKPHOS 99 65 86  BILITOT  0.7 0.9 1.1  PROT 8.3* 6.2* 7.8  ALBUMIN 4.9 3.7 4.7   Recent Labs  Lab 07/30/17 1642  LIPASE 26   Recent Labs  Lab 07/29/17 0138  AMMONIA 21    Coagulation Profile: Recent Labs  Lab 07/28/17 1605 07/29/17 1518  INR 0.88 0.94    Cardiac Enzymes: No results for input(s): CKTOTAL, CKMB, CKMBINDEX, TROPONINI in the last 168 hours.  BNP (last 3 results) No results for input(s): PROBNP in the last 8760 hours.  HbA1C: No results for input(s): HGBA1C in the last 72 hours.  CBG: No results for input(s): GLUCAP in the last 168 hours.  Lipid Profile: No results for input(s): CHOL, HDL, LDLCALC, TRIG, CHOLHDL, LDLDIRECT in the last 72 hours.  Thyroid Function Tests: No results for input(s): TSH, T4TOTAL, FREET4, T3FREE, THYROIDAB in the last 72 hours.  Anemia Panel: No results for input(s): VITAMINB12, FOLATE, FERRITIN, TIBC, IRON, RETICCTPCT in the last 72 hours.  Urine analysis:    Component Value Date/Time   COLORURINE STRAW (A) 07/28/2017 2145   APPEARANCEUR CLEAR 07/28/2017 2145   LABSPEC >1.046 (H) 07/28/2017 2145   PHURINE 6.0 07/28/2017 2145   GLUCOSEU NEGATIVE 07/28/2017 2145   HGBUR NEGATIVE 07/28/2017 2145   BILIRUBINUR NEGATIVE 07/28/2017 2145   KETONESUR NEGATIVE 07/28/2017 2145   PROTEINUR NEGATIVE 07/28/2017 2145   NITRITE NEGATIVE 07/28/2017 2145   LEUKOCYTESUR NEGATIVE 07/28/2017 2145    Sepsis Labs: @LABRCNTIP (procalcitonin:4,lacticidven:4) ) Recent Results (from the past 240 hour(s))  MRSA PCR Screening     Status: None    Collection Time: 07/29/17 12:11 PM  Result Value Ref Range Status   MRSA by PCR NEGATIVE NEGATIVE Final    Comment:        The GeneXpert MRSA Assay (FDA approved for NASAL specimens only), is one component of a comprehensive MRSA colonization surveillance program. It is not intended to diagnose MRSA infection nor to guide or monitor treatment for MRSA infections. Performed at [facility], [address] [place], [place] [address]      Radiological Exams on Admission: Dg Chest 2 View  Result Date: 07/30/2017 CLINICAL DATA:  Hematemesis EXAM: CHEST - 2 VIEW COMPARISON:  07/18/2017 FINDINGS: Cardiac shadow is stable. The lungs are well aerated bilaterally. No focal infiltrate or sizable effusion is seen. Postsurgical changes are noted in the left clavicle as well as multiple anterior left ribs. These changes are stable from the prior study. No acute bony abnormality is noted. IMPRESSION: No acute abnormality seen. Electronically Signed   By: Alcide Clever M.D.   On: 07/30/2017 16:13    EKG: Independently reviewed.  Assessment/Plan Principal Problem:   Hematemesis Active Problems:   Epigastric pain   Gastritis with hemorrhage   Upper GI bleed   Alcohol abuse   GI/ Hematochezia of unknown etiology.    BUN is 9.  Per EDP on digital rectal exam stool is negative   EGD on 07/15/2017 was negative. CT A/P non diagnostic  Hemoglobin is 14.6.  Dr. Sherryll Burger spoke with ENT, Dr. Jenne Pane, who concludes urgent ENT consultation is not indicated at this time, however, if patient continues to bleed and no apparent GI source then he will be glad to consult .   Admit to telemetry Cycle CBC every 6 hours  Check PT/INR/PTT Type and screen complete NPO  Afrin bid Normal saline IV fluid  Protonix 40mg  IV BID  Hold all NSAIDs Re Consult GI for possible push endoscopy   Alcohol abuse and dependence and  at risk for withdrawal Telemetry  CIWA with Ativan per protocol  Tobacco abuse  without  nicotine withdrawal Nicotine patch offered, patient declined Counseled cessation     DVT prophylaxis:  SCD  Code Status:    Family Communication:  Discussed with patient Disposition Plan: Expect patient to be discharged to home after condition improves Consults called:     Admission status:    Marlowe Kays, [name] [facility]   Amion text  [phone]   07/30/2017, 6:12 PM

## 2017-07-30 NOTE — Progress Notes (Signed)
Pt was discovered not in room nor on the unit. Pt was fully dressed with NSL in lt arm when last seen. Rapport was good. Recently received IV pain medication as ordered per request. Charge RN, AC, and  Jennifer-AD made aware pt missing. Security called who checked all exits, entrances, units, halls, and parking lot with no success finding patient. Dr. Edward JollySilva on unit at the time of elopement and made aware pt had left without letting staff know or signing AMA paperwork. [facility] 30 minutes of exhaustive attempts to [facility].

## 2017-07-30 NOTE — Discharge Summary (Addendum)
[facility]   Patient Elopement/Discharge summary  Brief Narrative: Joeseph AmorRoberto Geppert is a 32 year old male with medical history of alcohol abuse and NSAID induced gastritis who had a prior open endoscopy in July 15, 2017 with findings of no ulceration and was discharged home with Protonix.  Patient returned to the hospital after having a trauma with associated hematemesis with bright red blood.  Patient underwent endoscopy with normal findings.  We will suggested ENT for possible nasopharyngeal cause.  Patient has been hemodynamically stable.  An ENT was consulted.  After seeing patient today nurse called me around 12:45 PM, patient has disappeared from his room not telling anyone and with his IV on his arm.  Earlier on my interview patient was asking for Dilaudid and to be [facility].  I explained that ENT has been consulted and was planning to see patient later on.   Patient was complaining of abdominal pain, very vague in description and reported that he has some mental inserts on his ribs which can cause pain at times. He is asking for dilaudid.  Exam Blood pressure 95/60, pulse 65, temperature 98.6 F (37 C), temperature source Oral, resp. rate 20, height 5\' 6"  (1.676 m), weight 68 kg (150 lb), SpO2 100 %.  GEN: Anxious  HEENT: Poor dentition, b/l choana swollen and erythematous, unable to see frank blood  CV: RRR S1S2 no murmurs Chest: CTA no wheezing  Abdomen: Epigastric tenderness, soft, non distended no rebound  Assessment/ hospital course:  Hematemesis Felt to be related to nasopharyngeal cause.  Patient underwent EGD with no possible culprit. Hemoglobin has remained stable despite bleeding.  On my exam patient had ? signs of posterior nasal bleed and poor oral hygiene. I did witnessed patient spitting out blood, ENT was consulted.   History of alcohol abuse Patient reported no alcohol use for the past 2-3 months  Patient Eloped, security and charge RN was  informed.    Latrelle DodrillEdwin Silva, MD   Time spent: 35 minutes

## 2017-07-30 NOTE — Progress Notes (Signed)
Patient removed telemetry monitor stating that he is leaving AMA, then sits on bed and states he doesn't want to come back if he leaves. Encouraged to stay to speak with doctor in the morning to decide what could be the cause of his issues. Patient decided he would stay and was given supplies for a shower.

## 2017-07-30 NOTE — ED Provider Notes (Signed)
[place] [facility] PROGRESSIVE CARE Provider Note   CSN: [id number] Arrival date & time: 07/30/17  1426     History   Chief Complaint Chief Complaint  Patient presents with  . Hematemesis    HPI Dylan Carrillo is a 32 y.o. male.  HPI  32 year old Hispanic male history of alcohol abuse, chronic NSAID use with induced gastritis has been discharged today after a family medicine admission for concern of upper GI bleed.  Patient had an endoscopy performed with no acute findings concerning for GI etiology (July 15, 2017, additional endoscopy during this admission).  Patient was on the family medicine service today when he eloped.  Patient went to work and is now back with multiple episodes of bloody emesis with generalized abdominal pain similar to what brought him in with this recent admission.  Patient denies any chest pain, shortness of breath, fever or additional trauma.     Past Medical History:  Diagnosis Date  . Hematemesis 07/14/2017    Patient Active Problem List   Diagnosis Date Noted  . Alcohol abuse 07/30/2017  . Upper GI bleed 07/28/2017  . GI bleeding 07/28/2017  . Gastritis with hemorrhage 07/16/2017  . Epigastric pain   . Hematemesis 07/14/2017    Past Surgical History:  Procedure Laterality Date  . ABDOMINAL SURGERY    . ESOPHAGOGASTRODUODENOSCOPY N/A 07/15/2017   Procedure: ESOPHAGOGASTRODUODENOSCOPY (EGD);  Surgeon: [name], [name], MD;  [facility];  Service: Gastroenterology;  Laterality: N/A;  . ESOPHAGOGASTRODUODENOSCOPY (EGD) WITH PROPOFOL N/A 07/29/2017   Procedure: ESOPHAGOGASTRODUODENOSCOPY (EGD) WITH PROPOFOL;  Surgeon: Napoleon FormNandigam, Kavitha V, MD;  Location: [facility] ENDOSCOPY;  Service: Endoscopy;  Laterality: N/A;  . NO PAST SURGERIES    . RIB FRACTURE SURGERY    . SHOULDER SURGERY         Home Medications    Prior to Admission medications   Medication Sig Start Date End Date Taking? Authorizing Provider  acetaminophen (TYLENOL)  325 MG tablet Take 2 tablets (650 mg total) by mouth every 6 (six) hours as needed for mild pain (or Fever >/= 101). 07/19/17  Yes Scherrie GerlachHuang, Jennifer, MD  pantoprazole (PROTONIX) 40 MG tablet Take 1 tablet (40 mg total) by mouth daily for 14 days. 07/19/17 08/02/17 Yes Scherrie GerlachHuang, Jennifer, MD    Family History History reviewed. No pertinent family history.  Social History Social History   Tobacco Use  . Smoking status: Current Every Day Smoker    Years: 15.00    Types: Cigarettes  . Smokeless tobacco: Never Used  Substance Use Topics  . Alcohol use: Yes    Alcohol/week: 7.2 oz    Types: 12 Cans of beer per week    Comment: weekend   . Drug use: No     Allergies   Patient has no known allergies.   Review of Systems Review of Systems  Review of Systems  Constitutional: Negative for fever and chills.  HENT: Negative for ear pain, sore throat and trouble swallowing.   Eyes: Negative for pain and visual disturbance.  Respiratory: Negative for cough and shortness of breath.   Cardiovascular: Negative for chest pain and leg swelling.  Gastrointestinal:see HPI Genitourinary: Negative for dysuria, urgency and frequency.  Musculoskeletal: Negative for back pain and joint swelling.  Skin: Negative for rash and wound.  Neurological: Negative for dizziness, syncope, speech difficulty, weakness and numbness.   Physical Exam Updated Vital Signs BP 101/81 (BP Location: Right Arm)   Pulse (!) 111   Temp 97.7 F (36.5  C) (Oral)   Resp 16   Ht 5\' 6"  (1.676 m)   Wt 63.4 kg (139 lb 11.2 oz)   SpO2 96%   BMI 22.55 kg/m   Physical Exam  Physical Exam Vitals:   07/30/17 2152 07/31/17 0500  BP: 115/79 101/81  Pulse: (!) 111   Resp: 16   Temp:  97.7 F (36.5 C)  SpO2: 100% 96%   Constitutional: Patient is in no acute distress Head: Normocephalic and atraumatic.; neg blood   Eyes: Extraocular motion intact, no scleral icterus Neck: Supple without meningismus, mass, or overt  JVD Respiratory: Effort normal and breath sounds normal. No respiratory distress. CV: Heart regular rate and rhythm, no obvious murmurs.  Pulses +2 and symmetric Abdomen: Soft, non-tender, non-distended MSK: Extremities are atraumatic without deformity, ROM intact Skin: Warm, dry, intact Neuro: Alert and oriented, no motor deficit noted Psychiatric: Mood and affect are normal.  ED Treatments / Results  Labs (all labs ordered are listed, but only abnormal results are displayed) Labs Reviewed  COMPREHENSIVE METABOLIC PANEL - Abnormal; Notable for the following components:      Result Value   Potassium 3.4 (*)    All other components within normal limits  BASIC METABOLIC PANEL - Abnormal; Notable for the following components:   Calcium 8.5 (*)    All other components within normal limits  CBC - Abnormal; Notable for the following components:   RDW 15.7 (*)    All other components within normal limits  LIPASE, BLOOD  CBC  PROTIME-INR  URINALYSIS, ROUTINE W REFLEX MICROSCOPIC  OCCULT BLOOD X 1 CARD TO LAB, STOOL  CBC  CBC  CBC  I-STAT CG4 LACTIC ACID, ED  I-STAT CG4 LACTIC ACID, ED  I-STAT CG4 LACTIC ACID, ED  I-STAT CG4 LACTIC ACID, ED  TYPE AND SCREEN    EKG  EKG Interpretation None       Radiology Dg Chest 2 View  Result Date: 07/30/2017 CLINICAL DATA:  Hematemesis EXAM: CHEST - 2 VIEW COMPARISON:  07/18/2017 FINDINGS: Cardiac shadow is stable. The lungs are well aerated bilaterally. No focal infiltrate or sizable effusion is seen. Postsurgical changes are noted in the left clavicle as well as multiple anterior left ribs. These changes are stable from the prior study. No acute bony abnormality is noted. IMPRESSION: No acute abnormality seen. Electronically Signed   By: Alcide Clever M.D.   On: 07/30/2017 16:13    Procedures Procedures (including critical care time)  Medications Ordered in ED Medications  0.9 %  sodium chloride infusion ( Intravenous New Bag/Given  07/30/17 1638)  HYDROmorphone (DILAUDID) injection 1 mg (1 mg Intravenous Given 07/31/17 0723)  pantoprazole (PROTONIX) injection 40 mg (40 mg Intravenous Given 07/30/17 2157)  acetaminophen (TYLENOL) tablet 650 mg (not administered)    Or  acetaminophen (TYLENOL) suppository 650 mg (not administered)  senna-docusate (Senokot-S) tablet 1 tablet (not administered)  bisacodyl (DULCOLAX) suppository 10 mg (not administered)  ondansetron (ZOFRAN) tablet 4 mg (not administered)    Or  ondansetron (ZOFRAN) injection 4 mg (not administered)  0.9 %  sodium chloride infusion ( Intravenous New Bag/Given 07/30/17 2204)  ondansetron (ZOFRAN) injection 4 mg (4 mg Intravenous Given 07/30/17 1637)  pantoprazole (PROTONIX) injection 40 mg (40 mg Intravenous Given 07/30/17 1637)  HYDROmorphone (DILAUDID) injection 1 mg (1 mg Intravenous Given 07/30/17 1715)     Initial Impression / Assessment and Plan / ED Course  I have reviewed the triage vital signs and the nursing notes.  Pertinent labs &  imaging results that were available during my care of the patient were reviewed by me and considered in my medical decision making (see chart for details).     32 year old Hispanic male history of alcohol abuse, chronic NSAID use with induced gastritis has been discharged today after a family medicine admission for concern of upper GI bleed.  Patient had an endoscopy performed with no acute findings concerning for GI etiology (July 15, 2017, additional endoscopy during this admission).  Patient was on the family medicine service today when he eloped.  Patient went to work and is now back with multiple episodes of bloody emesis with generalized abdominal pain similar to what brought him in with this recent admission.  Patient denies any chest pain, shortness of breath, fever or additional trauma.  Patient is uncomfortable appearing.  Physical exam as annotated above but otherwise he medically stable.  Patient is actively vomiting  bloody emesis.  Patient is hemodynamically stable.Comparison of hemoglobin during admission was 15 and interval change to 14.6 which is stable.  No evidence of lactic acidosis.Chest x-ray with no acute findings.  Patient has had what appears to be a total of estimated 600 mL of bloody emesis.  Negative coagulopathy with a INR of 1.05.  Unclear etiology for patient's bloody emesis/abdominal pain but it is impressive and does not appear to be a slow ooze.  This requires further evaluation by specialty services.  Plan for admission to the hospitalist group for evaluation.  Patient dynamically stable throughout.    Final Clinical Impressions(s) / ED Diagnoses   Final diagnoses:  Hematemesis with nausea  Generalized abdominal pain    ED Discharge Orders    None       Jaynie Collins, DO 07/31/17 1914    Derwood Kaplan, MD 08/01/17 1352
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
## 2017-07-30 NOTE — Progress Notes (Signed)
Discontinued tele sitter, patient is not pulling on tubes or trying to get out of bed without assistance.  He's Alert and oriented X4.  Report info to oncoming nurse.

## 2017-07-30 NOTE — Progress Notes (Signed)
Discontinued Tele sitter bec patient was agitated by it. Dylan Bibleat was going to leave AMA bec he wanted dilaudid and felt pain was not managed well with Morphine.  NP notified and he received one time dose.  He decided to stay.

## 2017-07-30 NOTE — ED Provider Notes (Signed)
Patient placed in Quick Look pathway, seen and evaluated   Chief Complaint: vomiting blood  HPI:  32 y.o. male who was admitted last week for hematemesis presents today with c/o same symptoms.   ROS: GI: nausea, vomiting blood, abdominal pain  Physical Exam:   Gen: No distress  Neuro: Awake and Alert  Skin: Warm  Abdomen: tender on palpation  Patient actively vomiting blood and holding  his abdomin groaning with pain.    Patient to be placed in next open room.    Initiation of care has begun. The patient has been counseled on the process, plan, and necessity for staying for the completion/evaluation, and the remainder of the medical screening examination    Janne Napoleoneese, Mandy Peeks M, NP 07/30/17 1501    Shaune PollackIsaacs, Cameron, MD 07/31/17 [id number]

## 2017-07-31 ENCOUNTER — Inpatient Hospital Stay (HOSPITAL_COMMUNITY): Payer: Self-pay

## 2017-07-31 DIAGNOSIS — D649 Anemia, unspecified: Secondary | ICD-10-CM

## 2017-07-31 DIAGNOSIS — F1010 Alcohol abuse, uncomplicated: Secondary | ICD-10-CM

## 2017-07-31 DIAGNOSIS — R042 Hemoptysis: Secondary | ICD-10-CM | POA: Diagnosis present

## 2017-07-31 LAB — BASIC METABOLIC PANEL
Anion gap: 9 (ref 5–15)
BUN: 8 mg/dL (ref 6–20)
CALCIUM: 8.5 mg/dL — AB (ref 8.9–10.3)
CO2: 23 mmol/L (ref 22–32)
CREATININE: 0.83 mg/dL (ref 0.61–1.24)
Chloride: 105 mmol/L (ref 101–111)
GFR calc Af Amer: >60 mL/min (ref >60)
GLUCOSE: 81 mg/dL (ref 65–99)
Potassium: 4.3 mmol/L (ref 3.5–5.1)
Sodium: 137 mmol/L (ref 135–145)

## 2017-07-31 LAB — PROTIME-INR
INR: 1.05
Prothrombin Time: 13.6 seconds (ref 11.4–15.2)

## 2017-07-31 MED ORDER — PANTOPRAZOLE SODIUM 40 MG PO TBEC
40.0000 mg | DELAYED_RELEASE_TABLET | Freq: Every day | ORAL | Status: DC
  Administered 2017-07-31 – 2017-08-01 (×2): 40 mg via ORAL
  Filled 2017-07-31 (×2): qty 1

## 2017-07-31 MED ORDER — IOPAMIDOL (ISOVUE-370) INJECTION 76%
INTRAVENOUS | Status: AC
  Administered 2017-07-31: 100 mL
  Filled 2017-07-31: qty 100
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
## 2017-07-31 NOTE — Progress Notes (Signed)
Patient admitted to room 5w09. Alert/orientedx4. Room air. Admitted with severe abdominal pain and blood in emesis. Patient currently has pain 10/10 post iv dilaudid prn given in ED. Patient is able to ambulate in room without difficulty. NSR on tele monitor. Vitals stable. Admission education completed.

## 2017-08-01 LAB — CBC
HEMATOCRIT: 44.2 % (ref 39.0–52.0)
HEMOGLOBIN: 14.1 g/dL (ref 13.0–17.0)
MCH: 28.2 pg (ref 26.0–34.0)
MCHC: 31.9 g/dL (ref 30.0–36.0)
MCV: 88.4 fL (ref 78.0–100.0)
Platelets: 284 10*3/uL (ref 150–400)
RBC: 5 MIL/uL (ref 4.22–5.81)
RDW: 15.4 % (ref 11.5–15.5)
WBC: 4.3 10*3/uL (ref 4.0–10.5)

## 2017-08-01 MED ORDER — HYDROCODONE-ACETAMINOPHEN 5-325 MG PO TABS: 1.0000 | ORAL_TABLET | ORAL | 0 refills | Status: AC | PRN

## 2017-08-01 MED ORDER — PANTOPRAZOLE SODIUM 40 MG PO TBEC: 40.0000 mg | DELAYED_RELEASE_TABLET | Freq: Every day | ORAL | 2 refills | Status: AC

## 2017-08-01 MED ORDER — CYCLOBENZAPRINE HCL 5 MG PO TABS: 5.0000 mg | ORAL_TABLET | Freq: Three times a day (TID) | ORAL | 0 refills | Status: AC | PRN

## 2017-08-01 MED ORDER — BISACODYL 10 MG RE SUPP: 10.0000 mg | Freq: Every day | RECTAL | 0 refills | Status: AC | PRN

## 2017-08-01 NOTE — Discharge Instructions (Signed)
Hematemesis °Hematemesis is when you vomit blood. It is a sign of bleeding in the upper part of your digestive tract. This is also called your gastrointestinal (GI) tract. Your upper GI tract includes your mouth, throat, esophagus, stomach, and the first part of your small intestine (duodenum). °Hematemesis is usually caused by bleeding from your esophagus or stomach. You may suddenly vomit bright red blood. You might also vomit old blood. It may look like coffee grounds. You may also have other symptoms, such as: °· Stomach pain. °· Heartburn. °· Black and tarry stool. ° °Follow these instructions at home: °Watch your hematemesis for any changes. The following actions may help to lessen any discomfort you are feeling: °· Take medicines only as directed by your health care provider. Do not take aspirin, ibuprofen, or any other anti-inflammatory medicine without approval from your health care provider. °· Rest as needed. °· Drink small sips of clear liquids often, as long as you can keep them down. Try to drink enough fluids to keep your urine clear or pale yellow. °· Do not drink alcohol. °· Do not use any tobacco products, including cigarettes, chewing tobacco, or electronic cigarettes. If you need help quitting, ask your health care provider. °· Keep all follow-up visits as directed by your health care provider. This is important. ° °Contact a health care provider if: °· The vomiting of blood worsens, or begins again after it has stopped. °· You have persistent stomach pain. °· You have nausea, indigestion, or heartburn. °· You feel weak or dizzy. °Get help right away if: °· You faint or feel extremely weak. °· You have a rapid heartbeat. °· You are urinating less than normal or not at all. °· You have persistent vomiting. °· You vomit large amounts of bloody or dark material. °· You vomit bright red blood. °· You pass large, dark, or bloody stools. °· You have chest pain or trouble breathing. °This information is  not intended to replace advice given to you by your health care provider. Make sure you discuss any questions you have with your health care provider. °Document Released: 06/19/2004 Document Revised: 10/18/2015 Document Reviewed: 01/04/2014 °Elsevier Interactive Patient Education © 2018 [employer] ° °

## 2017-08-01 NOTE — Progress Notes (Signed)
Pt discharged to home. PIV removed, AVS reviewed. Prescriptions provided to pt. Pt ambulated off unit with belongings including prescriptions in hand. Pt to be transported home by friend.
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
## 2017-08-03 ENCOUNTER — Ambulatory Visit: Payer: Self-pay

## 2018-08-03 IMAGING — CT CT ANGIO CHEST
2 of 6 series · 18 of 36 positions shown · IV contrast (iopamidol)
Comparison: Chest x-ray dated 07/30/2017

CLINICAL DATA: Hemoptysis.  Cough.

EXAM:
CT ANGIOGRAPHY CHEST WITH CONTRAST
TECHNIQUE: Multidetector CT imaging of the chest was performed using the
standard protocol during bolus administration of intravenous
contrast. Multiplanar CT image reconstructions and MIPs were
obtained to evaluate the vascular anatomy.
CONTRAST:  100mL GOHNDC-VJU IOPAMIDOL (GOHNDC-VJU) INJECTION 76%

[Series 8: pe thins · axial · 0.69mm/px · z∈[+926,+1160]mm · 17 of 264 slices shown]
[im 15/264  lung]
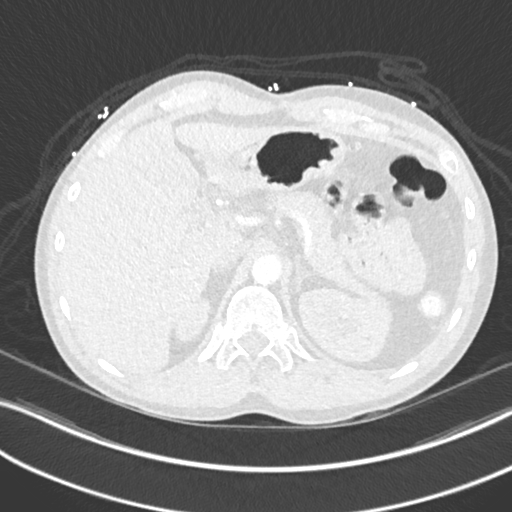
[im 30/264  mediastinal]
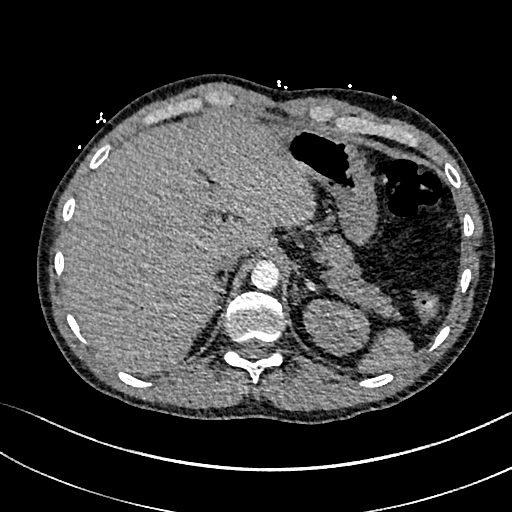
[im 44/264  lung]
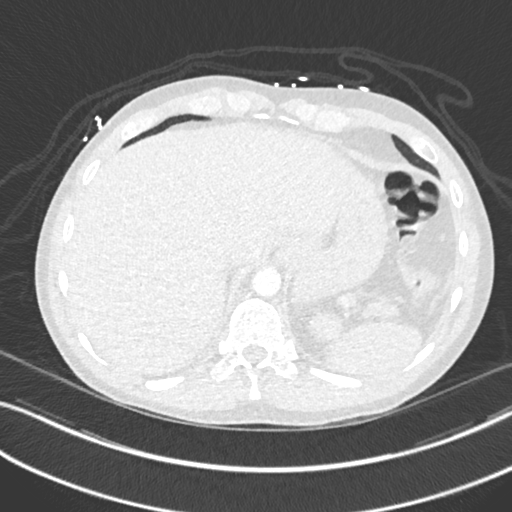
[im 59/264  mediastinal]
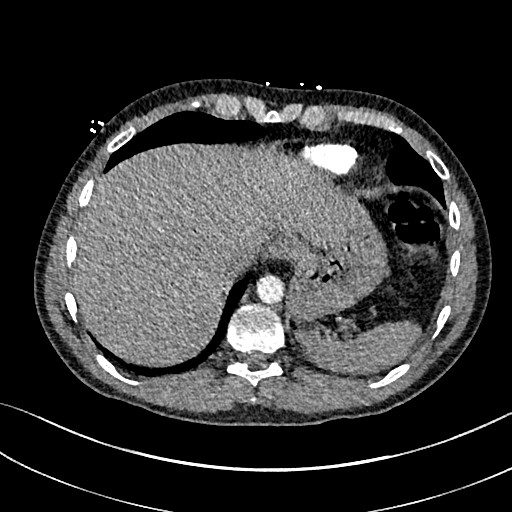
[im 74/264  lung]
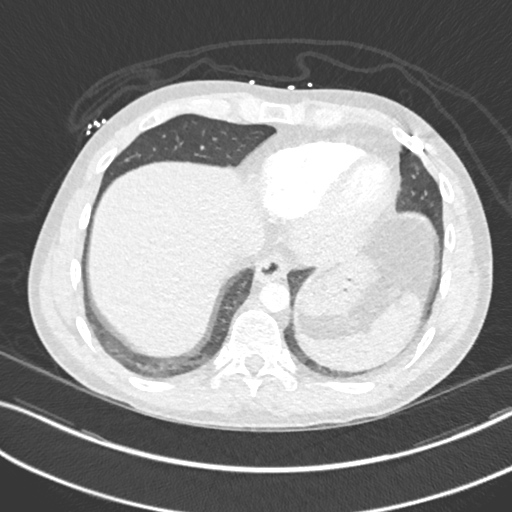
[im 88/264  mediastinal]
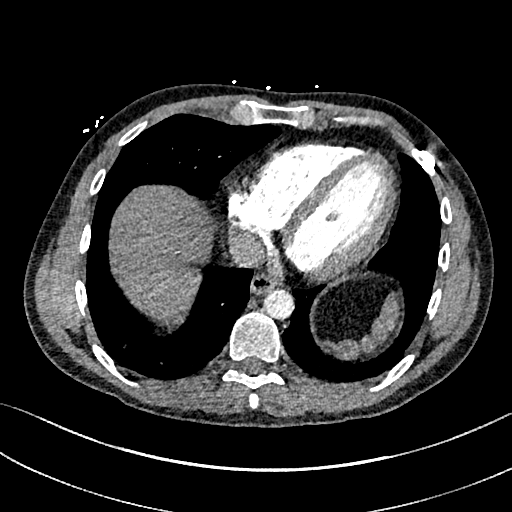
[im 103/264  lung]
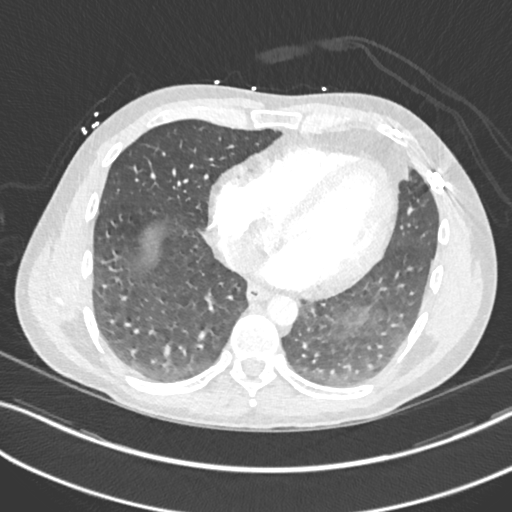
[im 117/264  mediastinal]
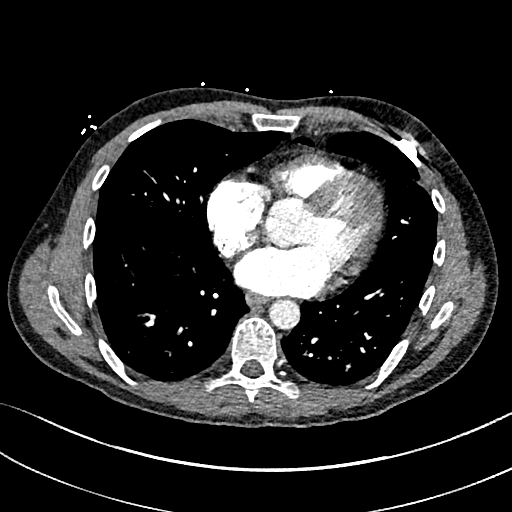
[im 132/264  lung]
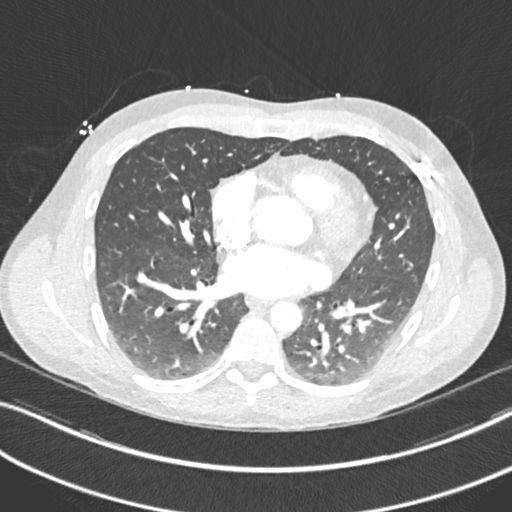
[im 147/264  mediastinal]
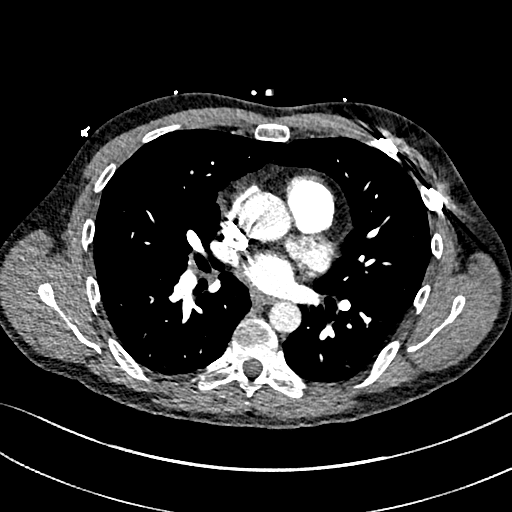
[im 161/264  lung]
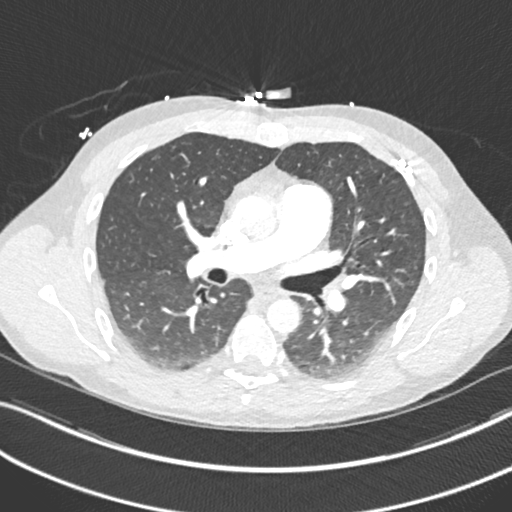
[im 176/264  mediastinal]
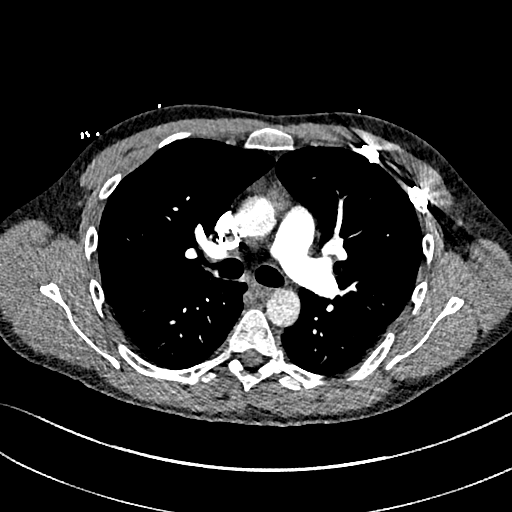
[im 190/264  lung]
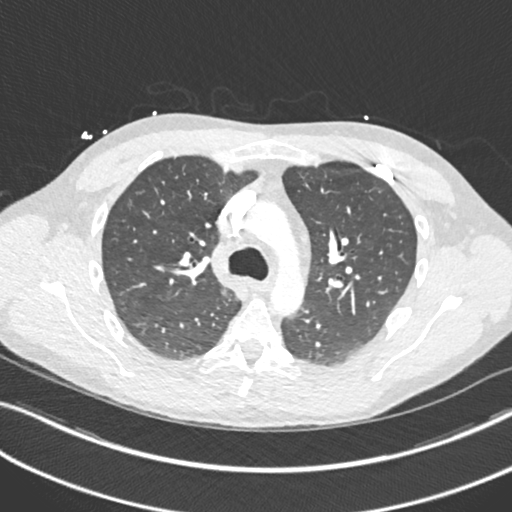
[im 205/264  mediastinal]
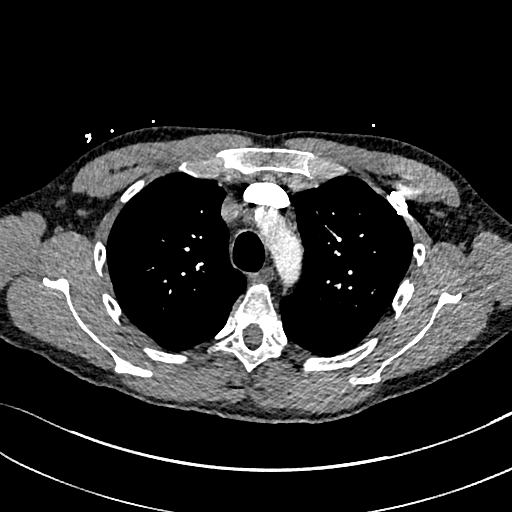
[im 220/264  lung]
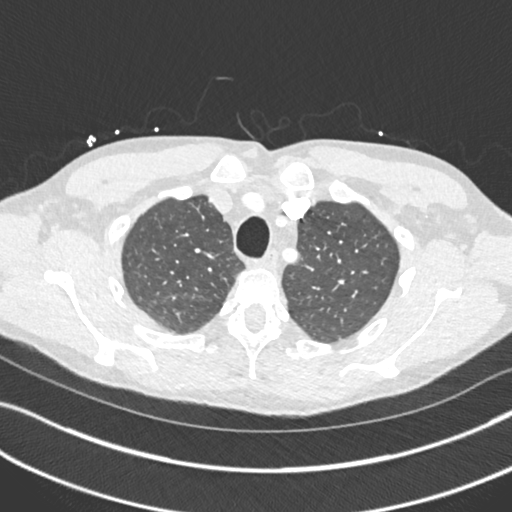
[im 234/264  mediastinal]
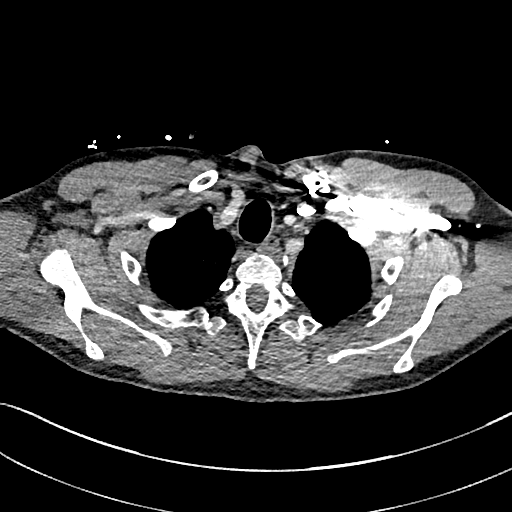
[im 249/264  lung]
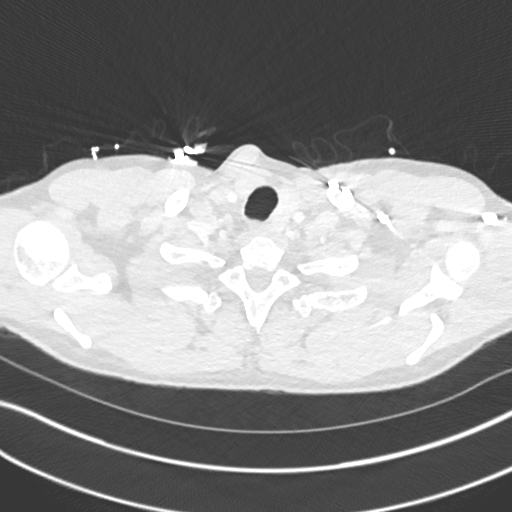

[Series 9: pe 2mm cor · coronal · 0.52mm/px · 1 of 131 slices shown]
[im 66/131  mediastinal]
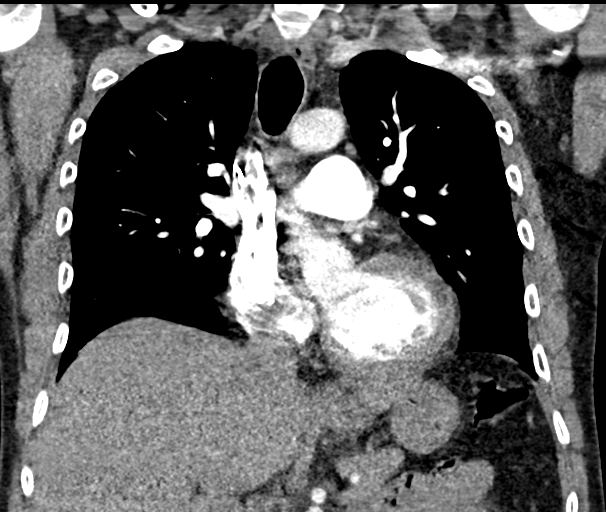

[18 of 36 positions shown; findings below may reference images not displayed]

FINDINGS: Cardiovascular: Satisfactory opacification of the pulmonary arteries
to the segmental level. No evidence of pulmonary embolism. Normal
heart size. No pericardial effusion.

Mediastinum/Nodes: No enlarged mediastinal, hilar, or axillary lymph
nodes. Thyroid gland, trachea, and esophagus demonstrate no
significant findings.

Lungs/Pleura: Lungs are clear. No pleural effusion or pneumothorax.

Upper Abdomen: Normal.

Musculoskeletal: No chest wall abnormality. No acute or significant
osseous findings. Multiple healed left rib fractures. Plates and
screws of the left third through fifth ribs and on the left
clavicle.

Slight anterior wedge deformities of several upper thoracic
vertebral bodies, not acute.

Review of the MIP images confirms the above findings.
IMPRESSION: 1. No pulmonary emboli.
2. No acute abnormalities of the chest.
